# Patient Record
Sex: Female | Born: 1960 | Race: White | Hispanic: No | Marital: Single | Smoking: Former smoker
Health system: Southern US, Community
[De-identification: ages and names within clinical notes are randomized; demographics above are authoritative.]

## PROBLEM LIST (undated history)

## (undated) DIAGNOSIS — M549 Dorsalgia, unspecified: Secondary | ICD-10-CM

## (undated) DIAGNOSIS — K219 Gastro-esophageal reflux disease without esophagitis: Secondary | ICD-10-CM

## (undated) DIAGNOSIS — E785 Hyperlipidemia, unspecified: Secondary | ICD-10-CM

## (undated) DIAGNOSIS — M1990 Unspecified osteoarthritis, unspecified site: Secondary | ICD-10-CM

## (undated) DIAGNOSIS — F32A Depression, unspecified: Secondary | ICD-10-CM

## (undated) DIAGNOSIS — G8929 Other chronic pain: Secondary | ICD-10-CM

## (undated) DIAGNOSIS — K259 Gastric ulcer, unspecified as acute or chronic, without hemorrhage or perforation: Secondary | ICD-10-CM

## (undated) DIAGNOSIS — F419 Anxiety disorder, unspecified: Secondary | ICD-10-CM

## (undated) DIAGNOSIS — I10 Essential (primary) hypertension: Secondary | ICD-10-CM

## (undated) HISTORY — PX: CHOLECYSTECTOMY: SHX55

## (undated) HISTORY — PX: COLONOSCOPY: SHX174

---

## 2019-05-26 ENCOUNTER — Ambulatory Visit: Payer: Managed Care, Other (non HMO) | Attending: Internal Medicine

## 2019-05-26 DIAGNOSIS — Z20822 Contact with and (suspected) exposure to covid-19: Secondary | ICD-10-CM | POA: Insufficient documentation

## 2019-05-27 LAB — NOVEL CORONAVIRUS, NAA: SARS-CoV-2, NAA: NOT DETECTED

## 2019-05-30 ENCOUNTER — Telehealth: Payer: Self-pay | Admitting: General Practice

## 2019-05-30 NOTE — Telephone Encounter (Signed)
Pt is aware covid 19 test is neg on 05-30-2019

## 2019-06-10 ENCOUNTER — Ambulatory Visit: Payer: Managed Care, Other (non HMO) | Attending: Internal Medicine

## 2019-06-10 DIAGNOSIS — Z20822 Contact with and (suspected) exposure to covid-19: Secondary | ICD-10-CM | POA: Insufficient documentation

## 2019-06-11 LAB — NOVEL CORONAVIRUS, NAA: SARS-CoV-2, NAA: NOT DETECTED

## 2019-06-13 ENCOUNTER — Telehealth: Payer: Self-pay | Admitting: General Practice

## 2019-06-13 NOTE — Telephone Encounter (Signed)
Patient informed of negative covid result. Patient verbalized understanding.   

## 2019-06-24 ENCOUNTER — Ambulatory Visit: Payer: Managed Care, Other (non HMO) | Attending: Internal Medicine

## 2019-06-24 DIAGNOSIS — Z20822 Contact with and (suspected) exposure to covid-19: Secondary | ICD-10-CM | POA: Insufficient documentation

## 2019-06-25 LAB — NOVEL CORONAVIRUS, NAA: SARS-CoV-2, NAA: NOT DETECTED

## 2019-08-19 ENCOUNTER — Other Ambulatory Visit: Payer: Self-pay

## 2019-08-19 ENCOUNTER — Ambulatory Visit
Admission: EM | Admit: 2019-08-19 | Discharge: 2019-08-19 | Disposition: A | Discharge status: Home / Self Care | Payer: Managed Care, Other (non HMO) | Attending: Emergency Medicine | Admitting: Emergency Medicine

## 2019-08-19 DIAGNOSIS — N3001 Acute cystitis with hematuria: Secondary | ICD-10-CM

## 2019-08-19 DIAGNOSIS — Z20822 Contact with and (suspected) exposure to covid-19: Secondary | ICD-10-CM | POA: Diagnosis not present

## 2019-08-19 DIAGNOSIS — M791 Myalgia, unspecified site: Secondary | ICD-10-CM

## 2019-08-19 HISTORY — DX: Essential (primary) hypertension: I10

## 2019-08-19 HISTORY — DX: Dorsalgia, unspecified: M54.9

## 2019-08-19 HISTORY — DX: Anxiety disorder, unspecified: F41.9

## 2019-08-19 HISTORY — DX: Gastric ulcer, unspecified as acute or chronic, without hemorrhage or perforation: K25.9

## 2019-08-19 HISTORY — DX: Gastro-esophageal reflux disease without esophagitis: K21.9

## 2019-08-19 LAB — POCT URINALYSIS DIP (MANUAL ENTRY)
Bilirubin, UA: NEGATIVE
Glucose, UA: NEGATIVE mg/dL
Ketones, POC UA: NEGATIVE mg/dL
Nitrite, UA: NEGATIVE
Protein Ur, POC: NEGATIVE mg/dL
Spec Grav, UA: >=1.03 — AB (ref 1.010–1.025)
Urobilinogen, UA: 0.2 E.U./dL
pH, UA: 6 (ref 5.0–8.0)

## 2019-08-19 NOTE — Discharge Instructions (Signed)
Your COVID test is pending - it is important to quarantine / isolate at home until your results are back. °If you test positive and would like further evaluation for persistent or worsening symptoms, you may schedule an E-visit or virtual (video) visit throughout the [facility] My[facility] app or website. ° °PLEASE NOTE: If you develop severe chest pain or shortness of breath please go to the ER or call 9-1-1 for further evaluation --> DO NOT schedule electronic or virtual visits for this. °Please call our office for further guidance / recommendations as needed. ° °For information about the Covid vaccine, please visit [email or web address] °

## 2019-08-19 NOTE — ED Triage Notes (Addendum)
Pt c/o body aches, chills and headache since yesterday. States last time this happened she had a UTI. Pt sent by employer for COVID testing. Denies any known exposure, has not been vaccinated. Denies urinary symptoms but states last time she had a UTI she did not have symptoms.

## 2019-08-19 NOTE — ED Provider Notes (Signed)
[facility]    CSN: [id number] Arrival date & time: 08/19/19  1250      History   Chief Complaint Chief Complaint  Patient presents with  . Generalized Body Aches    HPI Ariana Jarvis is a 59 y.o. female presenting for Covid testing.  States that she has had body aches, chills, headache since yesterday.  No fever, cough, difficulty breathing or chest pain.  States her employer is requesting Covid testing prior to returning to work.  Denies known exposure; has not been vaccinated.  Patient concerned about UTI despite no urinary symptoms.   Past Medical History:  Diagnosis Date  . Anxiety   . Back pain   . Gastric ulcer   . GERD (gastroesophageal reflux disease)   . Hypertension     There are no problems to display for this patient.   Past Surgical History:  Procedure Laterality Date  . CESAREAN SECTION     x2    OB History   No obstetric history on file.      Home Medications    Prior to Admission medications   Medication Sig Start Date End Date Taking? Authorizing Provider  citalopram (CELEXA) 40 MG tablet Take 40 mg by mouth daily.   Yes [provider]  COENZYME Q10 PO Take by mouth.   Yes [provider]  HYDROcodone-acetaminophen (NORCO) 10-325 MG tablet Take 1 tablet by mouth every 6 (six) hours as needed.   Yes [provider]  lisinopril-hydrochlorothiazide (ZESTORETIC) 20-12.5 MG tablet Take 1 tablet by mouth daily.   Yes [provider]  Omega-3 Fatty Acids (FISH OIL PO) Take by mouth.   Yes [provider]  omeprazole (PRILOSEC) 40 MG capsule Take 40 mg by mouth daily.   Yes [provider]  solifenacin (VESICARE) 10 MG tablet Take 10 mg by mouth daily. 08/08/19  Yes [provider]    Family History History reviewed. No pertinent family history.  Social History Social History   Tobacco Use  . Smoking status: Never Smoker  Substance Use Topics  . Alcohol use: Yes    . Drug use: Never     Allergies   Tramadol   Review of Systems As per HPI   Physical Exam Triage Vital Signs ED Triage Vitals  Enc Vitals Group     BP      Pulse      Resp      Temp      Temp src      SpO2      Weight      Height      Head Circumference      Peak Flow      Pain Score      Pain Loc      Pain Edu?      Excl. in GC?    No data found.  Updated Vital Signs BP 139/84   Pulse 82   Temp 97.8 F (36.6 C)   Resp 16   SpO2 99%   Visual Acuity Right Eye Distance:   Left Eye Distance:   Bilateral Distance:    Right Eye Near:   Left Eye Near:    Bilateral Near:     Physical Exam Constitutional:      General: She is not in acute distress. HENT:     Head: Normocephalic and atraumatic.  Eyes:     General: No scleral icterus.    Pupils: Pupils are equal, round, and reactive  to light.  Cardiovascular:     Rate and Rhythm: Normal rate.  Pulmonary:     Effort: Pulmonary effort is normal.  Skin:    Coloration: Skin is not jaundiced or pale.  Neurological:     Mental Status: She is alert and oriented to person, place, and time.      UC Treatments / Results  Labs (all labs ordered are listed, but only abnormal results are displayed) Labs Reviewed  POCT URINALYSIS DIP (MANUAL ENTRY) - Abnormal; Notable for the following components:      Result Value   Spec Grav, UA >=1.030 (*)    Blood, UA trace-intact (*)    Leukocytes, UA Small (1+) (*)    All other components within normal limits  NOVEL CORONAVIRUS, NAA  URINE CULTURE    EKG   Radiology No results found.  Procedures Procedures (including critical care time)  Medications Ordered in UC Medications - No data to display  Initial Impression / Assessment and Plan / UC Course  I have reviewed the triage vital signs and the nursing notes.  Pertinent labs & imaging results that were available during my care of the patient were reviewed by me and considered in my medical decision  making (see chart for details).     Patient afebrile, nontoxic, with SpO2 99%.  Covid PCR pending.  Patient to quarantine until results are back.  We will treat supportively as outlined below.  Urine dipstick significant for leukocytes, blood, elevated specific gravity: Culture pending.  Will defer antibiotics due to lack of symptoms. Return precautions discussed, patient verbalized understanding and is agreeable to plan. Final Clinical Impressions(s) / UC Diagnoses   Final diagnoses:  Encounter for laboratory testing for COVID-19 virus  Myalgia  Acute cystitis with hematuria     Discharge Instructions     Your COVID test is pending - it is important to quarantine / isolate at home until your results are back. If you test positive and would like further evaluation for persistent or worsening symptoms, you may schedule an E-visit or virtual (video) visit throughout the [facility] app or website.  PLEASE NOTE: If you develop severe chest pain or shortness of breath please go to the ER or call 9-1-1 for further evaluation --> DO NOT schedule electronic or virtual visits for this. Please call our office for further guidance / recommendations as needed.  For information about the Covid vaccine, please visit [email or web address]    ED Prescriptions    None     PDMP not reviewed this encounter.   Hall-Potvin, [place], [place] 08/19/19 1335

## 2019-08-20 LAB — URINE CULTURE: Culture: NO GROWTH

## 2019-08-20 LAB — NOVEL CORONAVIRUS, NAA: SARS-CoV-2, NAA: NOT DETECTED

## 2019-08-20 LAB — SARS-COV-2, NAA 2 DAY TAT

## 2019-10-09 ENCOUNTER — Other Ambulatory Visit: Payer: Self-pay

## 2019-10-09 ENCOUNTER — Ambulatory Visit
Admission: EM | Admit: 2019-10-09 | Discharge: 2019-10-09 | Disposition: A | Discharge status: Home / Self Care | Payer: Managed Care, Other (non HMO) | Attending: Physician Assistant | Admitting: Physician Assistant

## 2019-10-09 DIAGNOSIS — J3489 Other specified disorders of nose and nasal sinuses: Secondary | ICD-10-CM

## 2019-10-09 DIAGNOSIS — Z20828 Contact with and (suspected) exposure to other viral communicable diseases: Secondary | ICD-10-CM

## 2019-10-09 DIAGNOSIS — R05 Cough: Secondary | ICD-10-CM

## 2019-10-09 DIAGNOSIS — R059 Cough, unspecified: Secondary | ICD-10-CM

## 2019-10-09 DIAGNOSIS — R0981 Nasal congestion: Secondary | ICD-10-CM

## 2019-10-09 MED ORDER — PROMETHAZINE-DM 6.25-15 MG/5ML PO SYRP
5.0000 mL | ORAL_SOLUTION | Freq: Four times a day (QID) | ORAL | 0 refills | Status: DC | PRN
Start: 2019-10-09 — End: 2020-02-14

## 2019-10-09 MED ORDER — FLUTICASONE PROPIONATE 50 MCG/ACT NA SUSP
2.0000 | Freq: Every day | NASAL | 0 refills | Status: DC
Start: 2019-10-09 — End: 2020-02-14

## 2019-10-09 MED ORDER — BENZONATATE 200 MG PO CAPS
200.0000 mg | ORAL_CAPSULE | Freq: Three times a day (TID) | ORAL | 0 refills | Status: DC
Start: 2019-10-09 — End: 2020-02-14

## 2019-10-09 NOTE — ED Triage Notes (Signed)
Patient presents with c/o dry cough, chest congestion and headache over the past 5 days since receiving her 1st COVID vaccine shot.

## 2019-10-09 NOTE — Discharge Instructions (Signed)
COVID PCR testing ordered. I would like you to quarantine until testing results. Tessalon for cough. Cough syrup as needed for cough. Start flonase nasal spray for nasal drainage/sinus pressure. You can use over the counter nasal saline rinse such as neti pot for nasal congestion. Keep hydrated, your urine should be clear to pale yellow in color. Tylenol/motrin for fever and pain. Monitor for any worsening of symptoms, chest pain, shortness of breath, wheezing, swelling of the throat, go to the emergency department for further evaluation needed.

## 2019-10-09 NOTE — ED Provider Notes (Signed)
[facility]    CSN: [id number] Arrival date & time: 10/09/19  1429      History   Chief Complaint Chief Complaint  Patient presents with  . Cough  . Headache  . chest congestion    HPI Ariana Jarvis is a 59 y.o. female.   59 year old female comes in for 2 day of URI symptom after getting first COVID vaccine 3 days ago. Has had cough, chest congestion, headache, post nasal drainage, body aches. Denies fever, chills. Denies abdominal pain, nausea, vomiting, diarrhea. Denies shortness of breath, loss of taste/smell. Never smoker  [employer], therapeutic vaccine 10/06/2019     Past Medical History:  Diagnosis Date  . Anxiety   . Back pain   . Gastric ulcer   . GERD (gastroesophageal reflux disease)   . Hypertension     There are no problems to display for this patient.   Past Surgical History:  Procedure Laterality Date  . CESAREAN SECTION     x2    OB History   No obstetric history on file.      Home Medications    Prior to Admission medications   Medication Sig Start Date End Date Taking? Authorizing Provider  benzonatate (TESSALON) 200 MG capsule Take 1 capsule (200 mg total) by mouth every 8 (eight) hours. 10/09/19   Cathie Hoops, Terrance Usery V, PA-C  citalopram (CELEXA) 40 MG tablet Take 40 mg by mouth daily.    [provider]  COENZYME Q10 PO Take by mouth.    [provider]  fluticasone (FLONASE) 50 MCG/ACT nasal spray Place 2 sprays into both nostrils daily. 10/09/19   Belinda Fisher, PA-C  HYDROcodone-acetaminophen (NORCO) 10-325 MG tablet Take 1 tablet by mouth every 6 (six) hours as needed.    [provider]  lisinopril-hydrochlorothiazide (ZESTORETIC) 20-12.5 MG tablet Take 1 tablet by mouth daily.    [provider]  Omega-3 Fatty Acids (FISH OIL PO) Take by mouth.    [provider]  omeprazole (PRILOSEC) 40 MG capsule Take 40 mg by mouth daily.    [provider]  promethazine-dextromethorphan  (PROMETHAZINE-DM) 6.25-15 MG/5ML syrup Take 5 mLs by mouth 4 (four) times daily as needed for cough. 10/09/19   Cathie Hoops, Eilan Mcinerny V, PA-C  solifenacin (VESICARE) 10 MG tablet Take 10 mg by mouth daily. 08/08/19   [provider]    Family History Family History  Problem Relation Age of Onset  . Hypertension Mother   . Hypertension Father   . Cancer Father        lung     Social History Social History   Tobacco Use  . Smoking status: Never Smoker  . Smokeless tobacco: Never Used  Substance Use Topics  . Alcohol use: Yes  . Drug use: Never     Allergies   Tramadol   Review of Systems Review of Systems  Reason unable to perform ROS: See HPI as above.     Physical Exam Triage Vital Signs ED Triage Vitals  Enc Vitals Group     BP 10/09/19 1441 (!) 183/86     Pulse Rate 10/09/19 1441 87     Resp 10/09/19 1441 16     Temp 10/09/19 1441 99.1 F (37.3 C)     Temp Source 10/09/19 1441 Oral     SpO2 10/09/19 1441 95 %     Weight --      Height --      Head Circumference --  Peak Flow --      Pain Score 10/09/19 1443 4     Pain Loc --      Pain Edu? --      Excl. in GC? --    No data found.  Updated Vital Signs BP (!) 183/86 (BP Location: Left Arm)   Pulse 87   Temp 99.1 F (37.3 C) (Oral)   Resp 16   SpO2 95%   Physical Exam Constitutional:      General: She is not in acute distress.    Appearance: Normal appearance. She is well-developed. She is not ill-appearing, toxic-appearing or diaphoretic.  HENT:     Head: Normocephalic and atraumatic.     Right Ear: Tympanic membrane, ear canal and external ear normal. Tympanic membrane is not erythematous or bulging.     Left Ear: Tympanic membrane, ear canal and external ear normal. Tympanic membrane is not erythematous or bulging.     Nose: Congestion present.     Right Sinus: No maxillary sinus tenderness or frontal sinus tenderness.     Left Sinus: No maxillary sinus tenderness or frontal sinus tenderness.      Mouth/Throat:     Mouth: Mucous membranes are moist.     Pharynx: Oropharynx is clear. Uvula midline.  Eyes:     Conjunctiva/sclera: Conjunctivae normal.     Pupils: Pupils are equal, round, and reactive to light.  Cardiovascular:     Rate and Rhythm: Normal rate and regular rhythm.  Pulmonary:     Effort: Pulmonary effort is normal. No accessory muscle usage, prolonged expiration, respiratory distress or retractions.     Breath sounds: No decreased air movement or transmitted upper airway sounds. No decreased breath sounds.     Comments: LCTAB. Coughing throughout exam. Musculoskeletal:     Cervical back: Normal range of motion and neck supple.  Skin:    General: Skin is warm and dry.  Neurological:     Mental Status: She is alert and oriented to person, place, and time.      UC Treatments / Results  Labs (all labs ordered are listed, but only abnormal results are displayed) Labs Reviewed  NOVEL CORONAVIRUS, NAA    EKG   Radiology No results found.  Procedures Procedures (including critical care time)  Medications Ordered in UC Medications - No data to display  Initial Impression / Assessment and Plan / UC Course  I have reviewed the triage vital signs and the nursing notes.  Pertinent labs & imaging results that were available during my care of the patient were reviewed by me and considered in my medical decision making (see chart for details).    Patient afebrile, nontoxic in appearance.  Lungs clear to auscultation bilaterally without adventitious lung sounds.  However, coughing throughout exam. ? Post vaccine ADR vs viral illness vs seasonal allergies. Will swab for COVID. Symptomatic management discussed. Push fluids. Return precautions given.  Final Clinical Impressions(s) / UC Diagnoses   Final diagnoses:  Cough  Nasal congestion  Sinus pressure   ED Prescriptions    Medication Sig Dispense Auth. Provider   fluticasone (FLONASE) 50 MCG/ACT nasal  spray Place 2 sprays into both nostrils daily. 1 g Jewelz Kobus V, PA-C   benzonatate (TESSALON) 200 MG capsule Take 1 capsule (200 mg total) by mouth every 8 (eight) hours. 21 capsule [name] V, PA-C   promethazine-dextromethorphan (PROMETHAZINE-DM) 6.25-15 MG/5ML syrup Take 5 mLs by mouth 4 (four) times daily as needed for cough. 118 mL  Ok Edwards, PA-C     I have reviewed the PDMP during this encounter.   Ok Edwards, PA-C 10/09/19 1513

## 2019-10-10 LAB — NOVEL CORONAVIRUS, NAA: SARS-CoV-2, NAA: NOT DETECTED

## 2019-10-10 LAB — SARS-COV-2, NAA 2 DAY TAT

## 2020-02-14 ENCOUNTER — Ambulatory Visit
Admission: EM | Admit: 2020-02-14 | Discharge: 2020-02-14 | Disposition: A | Discharge status: Home / Self Care | Payer: Managed Care, Other (non HMO) | Attending: Emergency Medicine | Admitting: Emergency Medicine

## 2020-02-14 ENCOUNTER — Other Ambulatory Visit: Payer: Self-pay

## 2020-02-14 DIAGNOSIS — N3000 Acute cystitis without hematuria: Secondary | ICD-10-CM | POA: Insufficient documentation

## 2020-02-14 LAB — POCT URINALYSIS DIP (MANUAL ENTRY)
Bilirubin, UA: NEGATIVE
Blood, UA: NEGATIVE
Glucose, UA: NEGATIVE mg/dL
Ketones, POC UA: NEGATIVE mg/dL
Nitrite, UA: NEGATIVE
Protein Ur, POC: NEGATIVE mg/dL
Spec Grav, UA: 1.02 (ref 1.010–1.025)
Urobilinogen, UA: 1 E.U./dL
pH, UA: 7.5 (ref 5.0–8.0)

## 2020-02-14 MED ORDER — CEPHALEXIN 500 MG PO CAPS: 500.0000 mg | ORAL_CAPSULE | Freq: Two times a day (BID) | ORAL | 0 refills | Status: AC

## 2020-02-14 NOTE — ED Triage Notes (Signed)
Pt c/o lower back pain for the past 2wks. States feels like when she gets an UTI. Denies urinary sx's except color of urine and strong odor. States took some antibotics that she had from last time and it started to help some.

## 2020-02-14 NOTE — ED Provider Notes (Signed)
[facility]    CSN: [id number] Arrival date & time: 02/14/20  1047      History   Chief Complaint Chief Complaint  Patient presents with  . Urinary Tract Infection    HPI Ariana Jarvis is a 59 y.o. female  With history as below presenting for possible UTI.  Patient nursing history thereof: States it feels similar.  Presents with bilateral low back pain, bladder distention that is alleviated with urinating.  Has had some odor and discoloration.  States that she took unknown antibiotics at home from previous infection and helped a little bit.  Denying fever, hematuria, nausea or vomiting.  Past Medical History:  Diagnosis Date  . Anxiety   . Back pain   . Gastric ulcer   . GERD (gastroesophageal reflux disease)   . Hypertension     There are no problems to display for this patient.   Past Surgical History:  Procedure Laterality Date  . CESAREAN SECTION     x2    OB History   No obstetric history on file.      Home Medications    Prior to Admission medications   Medication Sig Start Date End Date Taking? Authorizing Provider  cephALEXin (KEFLEX) 500 MG capsule Take 1 capsule (500 mg total) by mouth 2 (two) times daily for 3 days. 02/14/20 02/17/20  Hall-Potvin, [place], PA-C  citalopram (CELEXA) 40 MG tablet Take 40 mg by mouth daily.    [provider]  COENZYME Q10 PO Take by mouth.    [provider]  HYDROcodone-acetaminophen (NORCO) 10-325 MG tablet Take 1 tablet by mouth every 6 (six) hours as needed.    [provider]  lisinopril-hydrochlorothiazide (ZESTORETIC) 20-12.5 MG tablet Take 1 tablet by mouth daily.    [provider]  Omega-3 Fatty Acids (FISH OIL PO) Take by mouth.    [provider]  omeprazole (PRILOSEC) 40 MG capsule Take 40 mg by mouth daily.    [provider]  solifenacin (VESICARE) 10 MG tablet Take 10 mg by mouth daily. 08/08/19   [provider]    Family  History Family History  Problem Relation Age of Onset  . Hypertension Mother   . Hypertension Father   . Cancer Father        lung     Social History Social History   Tobacco Use  . Smoking status: Never Smoker  . Smokeless tobacco: Never Used  Substance Use Topics  . Alcohol use: Yes  . Drug use: Never     Allergies   Tramadol   Review of Systems Review of Systems  Constitutional: Negative for fatigue and fever.  Respiratory: Negative for cough and shortness of breath.   Cardiovascular: Negative for chest pain and palpitations.  Gastrointestinal: Negative for constipation and diarrhea.  Genitourinary: Positive for dysuria and urgency. Negative for flank pain, frequency, hematuria, pelvic pain, vaginal bleeding, vaginal discharge and vaginal pain.     Physical Exam Triage Vital Signs ED Triage Vitals  Enc Vitals Group     BP      Pulse      Resp      Temp      Temp src      SpO2      Weight      Height      Head Circumference      Peak Flow      Pain Score      Pain Loc  Pain Edu?      Excl. in GC?    No data found.  Updated Vital Signs BP (!) 153/73 (BP Location: Left Arm)   Pulse (!) 57   Temp 98.1 F (36.7 C) (Oral)   Resp 18   SpO2 95%   Visual Acuity Right Eye Distance:   Left Eye Distance:   Bilateral Distance:    Right Eye Near:   Left Eye Near:    Bilateral Near:     Physical Exam Constitutional:      General: She is not in acute distress. HENT:     Head: Normocephalic and atraumatic.  Eyes:     General: No scleral icterus.    Pupils: Pupils are equal, round, and reactive to light.  Cardiovascular:     Rate and Rhythm: Normal rate.  Pulmonary:     Effort: Pulmonary effort is normal.  Abdominal:     General: Bowel sounds are normal.     Palpations: Abdomen is soft.     Tenderness: There is no abdominal tenderness. There is no right CVA tenderness, left CVA tenderness or guarding.  Skin:    Coloration: Skin is not  jaundiced or pale.  Neurological:     Mental Status: She is alert and oriented to person, place, and time.      UC Treatments / Results  Labs (all labs ordered are listed, but only abnormal results are displayed) Labs Reviewed  POCT URINALYSIS DIP (MANUAL ENTRY) - Abnormal; Notable for the following components:      Result Value   Leukocytes, UA Trace (*)    All other components within normal limits  URINE CULTURE    EKG   Radiology No results found.  Procedures Procedures (including critical care time)  Medications Ordered in UC Medications - No data to display  Initial Impression / Assessment and Plan / UC Course  I have reviewed the triage vital signs and the nursing notes.  Pertinent labs & imaging results that were available during my care of the patient were reviewed by me and considered in my medical decision making (see chart for details).     Urine with trace leukocytes, the patient did take unknown antibiotic.  Will send for culture, treat empirically in the interim given history.  Discussed at length taking prescription medications as prescribed.  Return precautions discussed, pt verbalized understanding and is agreeable to plan. Final Clinical Impressions(s) / UC Diagnoses   Final diagnoses:  Acute cystitis without hematuria     Discharge Instructions     Take antibiotic twice daily with food. Important to drink plenty of water throughout the day. Return for worsening urinary symptoms, blood in urine, abdominal or back pain, fever.    ED Prescriptions    Medication Sig Dispense Auth. Provider   cephALEXin (KEFLEX) 500 MG capsule Take 1 capsule (500 mg total) by mouth 2 (two) times daily for 3 days. 6 capsule Hall-Potvin, [place], PA-C     PDMP not reviewed this encounter.   Hall-Potvin, [place], [place] 02/14/20 1138

## 2020-02-14 NOTE — Discharge Instructions (Addendum)
Take antibiotic twice daily with food. Important to drink plenty of water throughout the day. Return for worsening urinary symptoms, blood in urine, abdominal or back pain, fever. 

## 2020-02-15 LAB — URINE CULTURE

## 2020-02-20 ENCOUNTER — Ambulatory Visit
Admission: EM | Admit: 2020-02-20 | Discharge: 2020-02-20 | Disposition: A | Discharge status: Home / Self Care | Payer: Managed Care, Other (non HMO) | Attending: Emergency Medicine | Admitting: Emergency Medicine

## 2020-02-20 DIAGNOSIS — Z20822 Contact with and (suspected) exposure to covid-19: Secondary | ICD-10-CM

## 2020-02-20 NOTE — Discharge Instructions (Signed)

## 2020-02-20 NOTE — ED Triage Notes (Signed)
Pt states had a covid exposure at work on Friday. States needs testing for work. Pt denies sx's.

## 2020-02-21 LAB — NOVEL CORONAVIRUS, NAA: SARS-CoV-2, NAA: NOT DETECTED

## 2020-02-21 LAB — SARS-COV-2, NAA 2 DAY TAT

## 2020-04-09 ENCOUNTER — Ambulatory Visit
Admission: EM | Admit: 2020-04-09 | Discharge: 2020-04-09 | Disposition: A | Discharge status: Home / Self Care | Payer: Managed Care, Other (non HMO)

## 2020-04-09 ENCOUNTER — Other Ambulatory Visit: Payer: Self-pay

## 2020-04-09 DIAGNOSIS — K1121 Acute sialoadenitis: Secondary | ICD-10-CM | POA: Diagnosis not present

## 2020-04-09 NOTE — ED Provider Notes (Signed)
[facility]    CSN: [id number] Arrival date & time: 04/09/20  1622      History   Chief Complaint Chief Complaint  Patient presents with  . Wound Check    Since last night    HPI Ariana Jarvis is a 59 y.o. female  Presenting for right cheek pain, swelling since last night.  Denies injury or inciting event.  Does not currently have teeth, nor does she wear dentures.  No ear pain, discharge, fever, tinnitus or dizziness.  Has not tried anything for this.  Nursing history of chronic dry mouth.  Past Medical History:  Diagnosis Date  . Anxiety   . Back pain   . Gastric ulcer   . GERD (gastroesophageal reflux disease)   . Hypertension     There are no problems to display for this patient.   Past Surgical History:  Procedure Laterality Date  . CESAREAN SECTION     x2    OB History   No obstetric history on file.      Home Medications    Prior to Admission medications   Medication Sig Start Date End Date Taking? Authorizing Provider  citalopram (CELEXA) 40 MG tablet Take 40 mg by mouth daily.   Yes [provider]  COENZYME Q10 PO Take by mouth.   Yes [provider]  gabapentin (NEURONTIN) 300 MG capsule Take 300 mg by mouth 3 (three) times daily.   Yes [provider]  HYDROcodone-acetaminophen (NORCO) 10-325 MG tablet Take 1 tablet by mouth every 6 (six) hours as needed.   Yes [provider]  lisinopril-hydrochlorothiazide (ZESTORETIC) 20-12.5 MG tablet Take 1 tablet by mouth daily.   Yes [provider]  Omega-3 Fatty Acids (FISH OIL PO) Take by mouth.   Yes [provider]  omeprazole (PRILOSEC) 40 MG capsule Take 40 mg by mouth daily.   Yes [provider]  solifenacin (VESICARE) 10 MG tablet Take 10 mg by mouth daily. 08/08/19  Yes [provider]    Family History Family History  Problem Relation Age of Onset  . Hypertension Mother   . Hypertension Father   .  Cancer Father        lung     Social History Social History   Tobacco Use  . Smoking status: Never Smoker  . Smokeless tobacco: Never Used  Vaping Use  . Vaping Use: Never used  Substance Use Topics  . Alcohol use: Yes  . Drug use: Never     Allergies   Tramadol   Review of Systems Review of Systems  Constitutional: Negative for fatigue and fever.  HENT: Negative for congestion, dental problem, ear pain, facial swelling, hearing loss, sinus pain, sore throat, trouble swallowing and voice change.   Eyes: Negative for photophobia, pain and visual disturbance.  Respiratory: Negative for cough and shortness of breath.   Cardiovascular: Negative for chest pain and palpitations.  Gastrointestinal: Negative for diarrhea and vomiting.  Musculoskeletal: Negative for arthralgias and myalgias.  Neurological: Negative for dizziness and headaches.     Physical Exam Triage Vital Signs ED Triage Vitals  Enc Vitals Group     BP      Pulse      Resp      Temp      Temp src      SpO2      Weight      Height      Head Circumference      Peak  Flow      Pain Score      Pain Loc      Pain Edu?      Excl. in GC?    No data found.  Updated Vital Signs BP (!) 144/81 (BP Location: Left Arm)   Pulse 63   Temp 98.2 F (36.8 C) (Oral)   Resp 18   SpO2 95%   Visual Acuity Right Eye Distance:   Left Eye Distance:   Bilateral Distance:    Right Eye Near:   Left Eye Near:    Bilateral Near:     Physical Exam Constitutional:      General: She is not in acute distress. HENT:     Head: Normocephalic and atraumatic.     Jaw: There is normal jaw occlusion. No tenderness or pain on movement.     Right Ear: Hearing, tympanic membrane, ear canal and external ear normal. No tenderness. No mastoid tenderness.     Left Ear: Hearing, tympanic membrane, ear canal and external ear normal. No tenderness. No mastoid tenderness.     Nose: No nasal deformity, septal deviation or nasal  tenderness.     Right Turbinates: Not swollen or pale.     Left Turbinates: Not swollen or pale.     Right Sinus: No maxillary sinus tenderness or frontal sinus tenderness.     Left Sinus: No maxillary sinus tenderness or frontal sinus tenderness.     Mouth/Throat:     Lips: Pink. No lesions.     Mouth: Mucous membranes are moist. No injury.     Pharynx: Oropharynx is clear. Uvula midline. No posterior oropharyngeal erythema or uvula swelling.     Comments: no tonsillar exudate or hypertrophy Cardiovascular:     Rate and Rhythm: Normal rate.  Pulmonary:     Effort: Pulmonary effort is normal.  Musculoskeletal:     Cervical back: Normal range of motion and neck supple. No muscular tenderness.  Lymphadenopathy:     Head:     Right side of head: Submandibular adenopathy present.     Left side of head: No submandibular adenopathy.     Cervical: No cervical adenopathy.     Right cervical: No superficial cervical adenopathy.    Left cervical: No superficial cervical adenopathy.  Neurological:     Mental Status: She is alert and oriented to person, place, and time.      UC Treatments / Results  Labs (all labs ordered are listed, but only abnormal results are displayed) Labs Reviewed  NOVEL CORONAVIRUS, NAA    EKG   Radiology No results found.  Procedures Procedures (including critical care time)  Medications Ordered in UC Medications - No data to display  Initial Impression / Assessment and Plan / UC Course  I have reviewed the triage vital signs and the nursing notes.  Pertinent labs & imaging results that were available during my care of the patient were reviewed by me and considered in my medical decision making (see chart for details).     Patient afebrile, nontoxic, with SpO2 95%.  Discussed lymphadenopathy VS acute parotitis.  Patient is fully vaccinated, swelling unilateral.  Requesting Covid testing-Covid PCR pending.  Patient to quarantine until results are  back.  We will treat supportively as outlined below.  Return precautions discussed, patient verbalized understanding and is agreeable to plan. Final Clinical Impressions(s) / UC Diagnoses   Final diagnoses:  Acute parotitis   Discharge Instructions   None    ED Prescriptions  None     PDMP not reviewed this encounter.   Hall-Potvin, [place], [place] 04/09/20 1715

## 2020-04-09 NOTE — ED Triage Notes (Signed)
Patient states that she woke from pain in her right cheek and noticed swelling and redness that has worsened throughout the day. Pt is aox4 and ambulatory.

## 2020-04-11 LAB — SARS-COV-2, NAA 2 DAY TAT

## 2020-04-11 LAB — NOVEL CORONAVIRUS, NAA: SARS-CoV-2, NAA: NOT DETECTED

## 2020-04-22 ENCOUNTER — Other Ambulatory Visit: Payer: Self-pay

## 2020-04-22 ENCOUNTER — Emergency Department (HOSPITAL_COMMUNITY)
Admission: EM | Admit: 2020-04-22 | Discharge: 2020-04-23 | Disposition: A | Discharge status: Home / Self Care | Payer: BC Managed Care – PPO | Attending: Emergency Medicine | Admitting: Emergency Medicine

## 2020-04-22 ENCOUNTER — Emergency Department (HOSPITAL_COMMUNITY): Payer: BC Managed Care – PPO

## 2020-04-22 DIAGNOSIS — U071 COVID-19: Secondary | ICD-10-CM | POA: Insufficient documentation

## 2020-04-22 DIAGNOSIS — Z5321 Procedure and treatment not carried out due to patient leaving prior to being seen by health care provider: Secondary | ICD-10-CM | POA: Diagnosis not present

## 2020-04-22 DIAGNOSIS — R0789 Other chest pain: Secondary | ICD-10-CM | POA: Diagnosis present

## 2020-04-22 LAB — CBC
HCT: 43.3 % (ref 36.0–46.0)
Hemoglobin: 14.8 g/dL (ref 12.0–15.0)
MCH: 28.6 pg (ref 26.0–34.0)
MCHC: 34.2 g/dL (ref 30.0–36.0)
MCV: 83.6 fL (ref 80.0–100.0)
Platelets: 191 10*3/uL (ref 150–400)
RBC: 5.18 MIL/uL — ABNORMAL HIGH (ref 3.87–5.11)
RDW: 13 % (ref 11.5–15.5)
WBC: 7.2 10*3/uL (ref 4.0–10.5)
nRBC: 0 % (ref 0.0–0.2)

## 2020-04-22 LAB — BASIC METABOLIC PANEL
Anion gap: 7 (ref 5–15)
BUN: 10 mg/dL (ref 6–20)
CO2: 23 mmol/L (ref 22–32)
Calcium: 10.4 mg/dL — ABNORMAL HIGH (ref 8.9–10.3)
Chloride: 106 mmol/L (ref 98–111)
Creatinine, Ser: 0.82 mg/dL (ref 0.44–1.00)
GFR, Estimated: >60 mL/min (ref >60)
Glucose, Bld: 103 mg/dL — ABNORMAL HIGH (ref 70–99)
Potassium: 4.5 mmol/L (ref 3.5–5.1)
Sodium: 136 mmol/L (ref 135–145)

## 2020-04-22 LAB — TROPONIN I (HIGH SENSITIVITY): Troponin I (High Sensitivity): 2 ng/L (ref <18)

## 2020-04-22 NOTE — ED Triage Notes (Signed)
Reported tested + for covid today, symptoms started yesterday. Patient c/o sharp mid chest pain; endorsed hx of HTN.

## 2020-04-23 NOTE — ED Notes (Signed)
Pt left due to not being seen quick enough 

## 2020-06-12 ENCOUNTER — Other Ambulatory Visit: Payer: Self-pay | Admitting: Orthopaedic Surgery

## 2020-06-12 DIAGNOSIS — M545 Low back pain, unspecified: Secondary | ICD-10-CM

## 2020-06-17 ENCOUNTER — Ambulatory Visit
Admission: RE | Admit: 2020-06-17 | Discharge: 2020-06-17 | Disposition: A | Discharge status: Home / Self Care | Payer: BC Managed Care – PPO | Source: Ambulatory Visit | Attending: Orthopaedic Surgery | Admitting: Orthopaedic Surgery

## 2020-06-17 ENCOUNTER — Other Ambulatory Visit: Payer: Self-pay

## 2020-06-17 DIAGNOSIS — M545 Low back pain, unspecified: Secondary | ICD-10-CM

## 2020-07-25 ENCOUNTER — Other Ambulatory Visit: Payer: Self-pay

## 2020-07-25 ENCOUNTER — Ambulatory Visit
Admission: EM | Admit: 2020-07-25 | Discharge: 2020-07-25 | Disposition: A | Discharge status: Home / Self Care | Payer: BC Managed Care – PPO | Attending: Family Medicine | Admitting: Family Medicine

## 2020-07-25 DIAGNOSIS — I959 Hypotension, unspecified: Secondary | ICD-10-CM

## 2020-07-25 DIAGNOSIS — R220 Localized swelling, mass and lump, head: Secondary | ICD-10-CM

## 2020-07-25 DIAGNOSIS — L0201 Cutaneous abscess of face: Secondary | ICD-10-CM

## 2020-07-25 MED ORDER — PREDNISONE 20 MG PO TABS: 20.0000 mg | ORAL_TABLET | Freq: Every day | ORAL | 0 refills | Status: AC

## 2020-07-25 MED ORDER — DEXAMETHASONE SODIUM PHOSPHATE 10 MG/ML IJ SOLN
10.0000 mg | Freq: Once | INTRAMUSCULAR | Status: AC
  Administered 2020-07-25: 10 mg via INTRAMUSCULAR

## 2020-07-25 MED ORDER — DOXYCYCLINE HYCLATE 100 MG PO CAPS: 100.0000 mg | ORAL_CAPSULE | Freq: Two times a day (BID) | ORAL | 0 refills | Status: DC

## 2020-07-25 MED ORDER — ONDANSETRON 4 MG PO TBDP: 4.0000 mg | ORAL_TABLET | Freq: Three times a day (TID) | ORAL | 0 refills | Status: DC | PRN

## 2020-07-25 MED ORDER — CEFTRIAXONE SODIUM 1 G IJ SOLR
1.0000 g | Freq: Once | INTRAMUSCULAR | Status: AC
  Administered 2020-07-25: 1 g via INTRAMUSCULAR

## 2020-07-25 NOTE — ED Provider Notes (Signed)
[facility]    CSN: [id number] Arrival date & time: 07/25/20  1042      History   Chief Complaint Chief Complaint  Patient presents with  . Abscess  . Facial Swelling    HPI Ariana Jarvis is a 60 y.o. female.   HPI Patient presents today with abscess and left lower facial swelling extending into the left buccal region. Patient is unaware if she had been bitten by insect. The lesion started as a small bump and over the week has increased in diameter. Denies fever. BP soft on initial check on recheck 108/64. She took her BP medication today.  Endorses nausea without vomiting that began last night. She has not take any tylenol or ibuprofen today. Past Medical History:  Diagnosis Date  . Anxiety   . Back pain   . Gastric ulcer   . GERD (gastroesophageal reflux disease)   . Hypertension     There are no problems to display for this patient.   Past Surgical History:  Procedure Laterality Date  . CESAREAN SECTION     x2    OB History   No obstetric history on file.      Home Medications    Prior to Admission medications   Medication Sig Start Date End Date Taking? Authorizing Provider  doxycycline (VIBRAMYCIN) 100 MG capsule Take 1 capsule (100 mg total) by mouth 2 (two) times daily. 07/25/20  Yes Bing Neighbors, FNP  ondansetron (ZOFRAN ODT) 4 MG disintegrating tablet Take 1 tablet (4 mg total) by mouth every 8 (eight) hours as needed for nausea or vomiting. 07/25/20  Yes Bing Neighbors, FNP  predniSONE (DELTASONE) 20 MG tablet Take 1 tablet (20 mg total) by mouth daily with breakfast for 5 days. 07/26/20 07/31/20 Yes Bing Neighbors, FNP  citalopram (CELEXA) 40 MG tablet Take 40 mg by mouth daily.    [provider]  COENZYME Q10 PO Take by mouth.    [provider]  gabapentin (NEURONTIN) 300 MG capsule Take 300 mg by mouth 3 (three) times daily.    [provider]  HYDROcodone-acetaminophen (NORCO) 10-325 MG  tablet Take 1 tablet by mouth every 6 (six) hours as needed.    [provider]  lisinopril-hydrochlorothiazide (ZESTORETIC) 20-12.5 MG tablet Take 1 tablet by mouth daily.    [provider]  Omega-3 Fatty Acids (FISH OIL PO) Take by mouth.    [provider]  omeprazole (PRILOSEC) 40 MG capsule Take 40 mg by mouth daily.    [provider]  solifenacin (VESICARE) 10 MG tablet Take 10 mg by mouth daily. 08/08/19   [provider]    Family History Family History  Problem Relation Age of Onset  . Hypertension Mother   . Hypertension Father   . Cancer Father        lung     Social History Social History   Tobacco Use  . Smoking status: Never Smoker  . Smokeless tobacco: Never Used  Vaping Use  . Vaping Use: Never used  Substance Use Topics  . Alcohol use: Yes  . Drug use: Never     Allergies   Tramadol   Review of Systems Review of Systems Pertinent negatives listed in HPI  Physical Exam Triage Vital Signs ED Triage Vitals  Enc Vitals Group     BP 07/25/20 1244 (!) 94/57     Pulse Rate 07/25/20 1244 75     Resp 07/25/20 1244 18  Temp 07/25/20 1244 98.3 F (36.8 C)     Temp Source 07/25/20 1244 Oral     SpO2 07/25/20 1244 96 %     Weight --      Height --      Head Circumference --      Peak Flow --      Pain Score 07/25/20 1243 5     Pain Loc --      Pain Edu? --      Excl. in GC? --    No data found.  Updated Vital Signs BP (!) 94/57 (BP Location: Left Arm)   Pulse 75   Temp 98.3 F (36.8 C) (Oral)   Resp 18   SpO2 96%   Visual Acuity Right Eye Distance:   Left Eye Distance:   Bilateral Distance:    Right Eye Near:   Left Eye Near:    Bilateral Near:     Physical Exam Constitutional:      Appearance: She is obese.  HENT:     Head:      Right Ear: Hearing normal.     Left Ear: Hearing normal.     Mouth/Throat:     Mouth: Mucous membranes are moist.  Cardiovascular:     Rate and  Rhythm: Normal rate and regular rhythm.  Pulmonary:     Effort: Pulmonary effort is normal.     Breath sounds: Normal breath sounds and air entry. No decreased air movement.  Neurological:     Mental Status: She is alert.  Psychiatric:        Attention and Perception: Attention normal.        Mood and Affect: Mood normal.        Speech: Speech normal.        Cognition and Memory: Cognition and memory normal.      UC Treatments / Results  Labs (all labs ordered are listed, but only abnormal results are displayed) Labs Reviewed  CBC WITH DIFFERENTIAL/PLATELET    EKG   Radiology No results found.  Procedures Procedures (including critical care time)  Medications Ordered in UC Medications  dexamethasone (DECADRON) injection 10 mg (10 mg Intramuscular Given 07/25/20 1330)  cefTRIAXone (ROCEPHIN) injection 1 g (1 g Intramuscular Given 07/25/20 1333)    Initial Impression / Assessment and Plan / UC Course  I have reviewed the triage vital signs and the nursing notes.  Pertinent labs & imaging results that were available during my care of the patient were reviewed by me and considered in my medical decision making (see chart for details).     Facial abscess/facial swelling treating in clinic today with Decadron 10 mg IM along with 1 g of Rocephin.  Patient discharged home with doxycycline and advised to attempt to get 2 doses in today.  Start oral prednisone in the morning. Patient advised to schedule an appointment to come in tomorrow for a recheck of facial swelling and abscess.  If abscess remains well or has increased in diameter will refer patient to the ER, however if improvement is seen we will continue treatment with antibiotics and have patient follow-up outpatient with general surgery.  Red flag precautions discussed that warrant immediate evaluation in the setting of the ER.  Patient verbalized understanding and agreement with plan. Final Clinical Impressions(s) / UC  Diagnoses   Final diagnoses:  Facial abscess  Facial swelling  Hypotension, unspecified hypotension type     Discharge Instructions     Take a dose of  doxycyline as as you pick up the medication and take another dose prior to bedtime.  Start prednisone in the morning.   Take tylenol and apply warm compresses for pain .  Zofran for nausea.  If you develop fever, or worsening facial swelling go immediately to the emergency department     ED Prescriptions    Medication Sig Dispense Auth. Provider   doxycycline (VIBRAMYCIN) 100 MG capsule Take 1 capsule (100 mg total) by mouth 2 (two) times daily. 20 capsule Bing Neighbors, FNP   predniSONE (DELTASONE) 20 MG tablet Take 1 tablet (20 mg total) by mouth daily with breakfast for 5 days. 5 tablet Bing Neighbors, FNP   ondansetron (ZOFRAN ODT) 4 MG disintegrating tablet Take 1 tablet (4 mg total) by mouth every 8 (eight) hours as needed for nausea or vomiting. 20 tablet Bing Neighbors, FNP     PDMP not reviewed this encounter.   Bing Neighbors, FNP 07/25/20 [phone]

## 2020-07-25 NOTE — Discharge Instructions (Signed)
Take a dose of doxycyline as as you pick up the medication and take another dose prior to bedtime.  Start prednisone in the morning.   Take tylenol and apply warm compresses for pain .  Zofran for nausea.  If you develop fever, or worsening facial swelling go immediately to the emergency department

## 2020-07-25 NOTE — ED Notes (Signed)
Pt instructed to make an appt to come back tomorrow to f/u with abscess. Pt understood and appt has been made.

## 2020-07-25 NOTE — ED Triage Notes (Signed)
Pt is present today with facial swelling and an a abscess. Pt states that she noticed the abscess last week. Pt states that along with the abscess she has had nausea and HA. Pt states that she tried to pop it last night and puss and blood drained from the site.

## 2020-07-26 ENCOUNTER — Ambulatory Visit
Admission: EM | Admit: 2020-07-26 | Discharge: 2020-07-26 | Disposition: A | Discharge status: Home / Self Care | Payer: BC Managed Care – PPO | Attending: Internal Medicine | Admitting: Internal Medicine

## 2020-07-26 VITALS — BP 124/79 | HR 58 | Temp 97.9°F | Resp 17

## 2020-07-26 DIAGNOSIS — R220 Localized swelling, mass and lump, head: Secondary | ICD-10-CM

## 2020-07-26 DIAGNOSIS — Z5189 Encounter for other specified aftercare: Secondary | ICD-10-CM

## 2020-07-26 DIAGNOSIS — L0201 Cutaneous abscess of face: Secondary | ICD-10-CM

## 2020-07-26 LAB — CBC WITH DIFFERENTIAL/PLATELET
Basophils Absolute: 0.1 10*3/uL (ref 0.0–0.2)
Basos: 0 %
EOS (ABSOLUTE): 0.5 10*3/uL — ABNORMAL HIGH (ref 0.0–0.4)
Eos: 3 %
Hematocrit: 48.9 % — ABNORMAL HIGH (ref 34.0–46.6)
Hemoglobin: 16.3 g/dL — ABNORMAL HIGH (ref 11.1–15.9)
Immature Grans (Abs): 0 10*3/uL (ref 0.0–0.1)
Immature Granulocytes: 0 %
Lymphocytes Absolute: 2.4 10*3/uL (ref 0.7–3.1)
Lymphs: 13 %
MCH: 29.1 pg (ref 26.6–33.0)
MCHC: 33.3 g/dL (ref 31.5–35.7)
MCV: 87 fL (ref 79–97)
Monocytes Absolute: 1.3 10*3/uL — ABNORMAL HIGH (ref 0.1–0.9)
Monocytes: 7 %
Neutrophils Absolute: 14.7 10*3/uL — ABNORMAL HIGH (ref 1.4–7.0)
Neutrophils: 77 %
Platelets: 303 10*3/uL (ref 150–450)
RBC: 5.61 x10E6/uL — ABNORMAL HIGH (ref 3.77–5.28)
RDW: 13.4 % (ref 11.7–15.4)
WBC: 18.9 10*3/uL — ABNORMAL HIGH (ref 3.4–10.8)

## 2020-07-26 NOTE — ED Triage Notes (Signed)
Pt presents for follow-up after abscess drainage yesterday. States feeling significantly better. States still draining slightly.

## 2021-01-17 ENCOUNTER — Other Ambulatory Visit: Payer: Self-pay | Admitting: Internal Medicine

## 2021-01-17 DIAGNOSIS — Z1231 Encounter for screening mammogram for malignant neoplasm of breast: Secondary | ICD-10-CM

## 2021-02-19 ENCOUNTER — Other Ambulatory Visit: Payer: Self-pay

## 2021-02-19 ENCOUNTER — Ambulatory Visit
Admission: RE | Admit: 2021-02-19 | Discharge: 2021-02-19 | Disposition: A | Discharge status: Home / Self Care | Payer: BC Managed Care – PPO | Source: Ambulatory Visit | Attending: Internal Medicine | Admitting: Internal Medicine

## 2021-02-19 DIAGNOSIS — Z1231 Encounter for screening mammogram for malignant neoplasm of breast: Secondary | ICD-10-CM

## 2021-03-18 ENCOUNTER — Ambulatory Visit
Admission: EM | Admit: 2021-03-18 | Discharge: 2021-03-18 | Disposition: A | Discharge status: Home / Self Care | Payer: BC Managed Care – PPO | Attending: Physician Assistant | Admitting: Physician Assistant

## 2021-03-18 DIAGNOSIS — J069 Acute upper respiratory infection, unspecified: Secondary | ICD-10-CM

## 2021-03-18 DIAGNOSIS — Z1152 Encounter for screening for COVID-19: Secondary | ICD-10-CM | POA: Diagnosis not present

## 2021-03-18 NOTE — ED Triage Notes (Signed)
Pt c/o pain in chest when coughing x2days. Pt has been around her niece and nephew who were sick. Pt is unsure if they had the flu.  Pt has not taken a covid test. Pt asks to be swabbed for covid.

## 2021-03-18 NOTE — ED Provider Notes (Signed)
[facility]    CSN: [id number] Arrival date & time: 03/18/21  1404      History   Chief Complaint Chief Complaint  Patient presents with   Cough    HPI Ariana Jarvis is a 60 y.o. female.   Patient here today for evaluation of congestion and cough since last 2 days.  She reports that she has developed some pain in her chest with cough.  She has some sore throat as well with cough.  She denies any ear pain.  She has not had known fever.  She denies any vomiting or diarrhea.  She states that she was recently around her grandchildren who have been sick.  The history is provided by the patient.  Cough Associated symptoms: sore throat   Associated symptoms: no chills, no ear pain, no eye discharge, no fever, no shortness of breath and no wheezing    Past Medical History:  Diagnosis Date   Anxiety    Back pain    Gastric ulcer    GERD (gastroesophageal reflux disease)    Hypertension     There are no problems to display for this patient.   Past Surgical History:  Procedure Laterality Date   CESAREAN SECTION     x2    OB History   No obstetric history on file.      Home Medications    Prior to Admission medications   Medication Sig Start Date End Date Taking? Authorizing Provider  citalopram (CELEXA) 40 MG tablet Take 40 mg by mouth daily.   Yes [provider]  COENZYME Q10 PO Take by mouth.   Yes [provider]  HYDROcodone-acetaminophen (NORCO) 10-325 MG tablet Take 1 tablet by mouth every 6 (six) hours as needed.   Yes [provider]  lisinopril-hydrochlorothiazide (ZESTORETIC) 20-12.5 MG tablet Take 1 tablet by mouth daily.   Yes [provider]  Omega-3 Fatty Acids (FISH OIL PO) Take by mouth.   Yes [provider]  omeprazole (PRILOSEC) 40 MG capsule Take 40 mg by mouth daily.   Yes [provider]  solifenacin (VESICARE) 10 MG tablet Take 10 mg by mouth daily. 08/08/19  Yes [provider]  doxycycline (VIBRAMYCIN) 100 MG capsule Take 1 capsule (100 mg total) by mouth 2 (two) times daily. 07/25/20   Bing Neighbors, FNP  gabapentin (NEURONTIN) 300 MG capsule Take 300 mg by mouth 3 (three) times daily.    [provider]  ondansetron (ZOFRAN ODT) 4 MG disintegrating tablet Take 1 tablet (4 mg total) by mouth every 8 (eight) hours as needed for nausea or vomiting. 07/25/20   Bing Neighbors, FNP    Family History Family History  Problem Relation Age of Onset   Hypertension Mother    Hypertension Father    Cancer Father        lung     Social History Social History   Tobacco Use   Smoking status: Never   Smokeless tobacco: Never  Vaping Use   Vaping Use: Never used  Substance Use Topics   Alcohol use: Yes   Drug use: Never     Allergies   Tramadol   Review of Systems Review of Systems  Constitutional:  Negative for chills and fever.  HENT:  Positive for congestion and sore throat. Negative for ear pain.   Eyes:  Negative for discharge and redness.  Respiratory:  Positive for cough. Negative for shortness of breath and wheezing.   Gastrointestinal:  Negative for abdominal pain, diarrhea, nausea and vomiting.    Physical Exam Triage Vital Signs ED Triage Vitals  Enc Vitals Group     BP 03/18/21 1521 137/76     Pulse Rate 03/18/21 1521 77     Resp 03/18/21 1521 18     Temp 03/18/21 1521 97.9 F (36.6 C)     Temp Source 03/18/21 1521 Oral     SpO2 03/18/21 1521 95 %     Weight 03/18/21 1519 230 lb (104.3 kg)     Height 03/18/21 1519 5\' 4"  (1.626 m)     Head Circumference --      Peak Flow --      Pain Score 03/18/21 1518 5     Pain Loc --      Pain Edu? --      Excl. in GC? --    No data found.  Updated Vital Signs BP 137/76 (BP Location: Left Arm)   Pulse 77   Temp 97.9 F (36.6 C) (Oral)   Resp 18   Ht 5\' 4"  (1.626 m)   Wt 230 lb (104.3 kg)   SpO2 95%   BMI 39.48 kg/m   Physical Exam Vitals and nursing  note reviewed.  Constitutional:      General: She is not in acute distress.    Appearance: Normal appearance. She is not ill-appearing.  HENT:     Head: Normocephalic and atraumatic.     Nose: Congestion present.     Mouth/Throat:     Mouth: Mucous membranes are moist.     Pharynx: No oropharyngeal exudate or posterior oropharyngeal erythema.  Eyes:     Conjunctiva/sclera: Conjunctivae normal.  Cardiovascular:     Rate and Rhythm: Normal rate and regular rhythm.     Heart sounds: Normal heart sounds. No murmur heard. Pulmonary:     Effort: Pulmonary effort is normal. No respiratory distress.     Breath sounds: Normal breath sounds. No wheezing, rhonchi or rales.  Skin:    General: Skin is warm and dry.  Neurological:     Mental Status: She is alert.  Psychiatric:        Mood and Affect: Mood normal.        Thought Content: Thought content normal.     UC Treatments / Results  Labs (all labs ordered are listed, but only abnormal results are displayed) Labs Reviewed  COVID-19, FLU A+B NAA    EKG   Radiology No results found.  Procedures Procedures (including critical care time)  Medications Ordered in UC Medications - No data to display  Initial Impression / Assessment and Plan / UC Course  I have reviewed the triage vital signs and the nursing notes.  Pertinent labs & imaging results that were available during my care of the patient were reviewed by me and considered in my medical decision making (see chart for details).    Suspect likely viral etiology of symptoms.  Will order COVID and flu screening.  Recommend symptomatic treatment and increase fluids.  Encouraged follow-up with any further concerns.  Final Clinical Impressions(s) / UC Diagnoses   Final diagnoses:  Acute upper respiratory infection   Discharge Instructions   None    ED Prescriptions   None    PDMP not reviewed this encounter.   14/05/22, PA-C 03/18/21 1559

## 2021-03-19 LAB — COVID-19, FLU A+B NAA
Influenza A, NAA: NOT DETECTED
Influenza B, NAA: NOT DETECTED
SARS-CoV-2, NAA: NOT DETECTED

## 2021-12-26 ENCOUNTER — Emergency Department (HOSPITAL_COMMUNITY)
Admission: EM | Admit: 2021-12-26 | Discharge: 2021-12-27 | Disposition: A | Discharge status: Home / Self Care | Payer: No Typology Code available for payment source | Attending: Student | Admitting: Student

## 2021-12-26 ENCOUNTER — Other Ambulatory Visit: Payer: Self-pay

## 2021-12-26 ENCOUNTER — Emergency Department (HOSPITAL_COMMUNITY): Payer: No Typology Code available for payment source

## 2021-12-26 ENCOUNTER — Encounter (HOSPITAL_COMMUNITY): Payer: Self-pay | Admitting: Emergency Medicine

## 2021-12-26 DIAGNOSIS — Z20822 Contact with and (suspected) exposure to covid-19: Secondary | ICD-10-CM | POA: Insufficient documentation

## 2021-12-26 DIAGNOSIS — R079 Chest pain, unspecified: Secondary | ICD-10-CM | POA: Diagnosis present

## 2021-12-26 DIAGNOSIS — Z5321 Procedure and treatment not carried out due to patient leaving prior to being seen by health care provider: Secondary | ICD-10-CM | POA: Insufficient documentation

## 2021-12-26 DIAGNOSIS — R110 Nausea: Secondary | ICD-10-CM | POA: Insufficient documentation

## 2021-12-26 LAB — URINALYSIS, ROUTINE W REFLEX MICROSCOPIC
Bilirubin Urine: NEGATIVE
Glucose, UA: NEGATIVE mg/dL
Hgb urine dipstick: NEGATIVE
Ketones, ur: NEGATIVE mg/dL
Nitrite: NEGATIVE
Protein, ur: NEGATIVE mg/dL
Specific Gravity, Urine: 1.012 (ref 1.005–1.030)
pH: 8 (ref 5.0–8.0)

## 2021-12-26 LAB — CBC WITH DIFFERENTIAL/PLATELET
Abs Immature Granulocytes: 0.03 10*3/uL (ref 0.00–0.07)
Basophils Absolute: 0 10*3/uL (ref 0.0–0.1)
Basophils Relative: 1 %
Eosinophils Absolute: 0.2 10*3/uL (ref 0.0–0.5)
Eosinophils Relative: 2 %
HCT: 42.6 % (ref 36.0–46.0)
Hemoglobin: 14.3 g/dL (ref 12.0–15.0)
Immature Granulocytes: 0 %
Lymphocytes Relative: 17 %
Lymphs Abs: 1.4 10*3/uL (ref 0.7–4.0)
MCH: 28.9 pg (ref 26.0–34.0)
MCHC: 33.6 g/dL (ref 30.0–36.0)
MCV: 86.1 fL (ref 80.0–100.0)
Monocytes Absolute: 0.4 10*3/uL (ref 0.1–1.0)
Monocytes Relative: 5 %
Neutro Abs: 6.4 10*3/uL (ref 1.7–7.7)
Neutrophils Relative %: 75 %
Platelets: 215 10*3/uL (ref 150–400)
RBC: 4.95 MIL/uL (ref 3.87–5.11)
RDW: 12.9 % (ref 11.5–15.5)
WBC: 8.5 10*3/uL (ref 4.0–10.5)
nRBC: 0 % (ref 0.0–0.2)

## 2021-12-26 LAB — COMPREHENSIVE METABOLIC PANEL
ALT: 79 U/L — ABNORMAL HIGH (ref 0–44)
AST: 106 U/L — ABNORMAL HIGH (ref 15–41)
Albumin: 3.9 g/dL (ref 3.5–5.0)
Alkaline Phosphatase: 102 U/L (ref 38–126)
Anion gap: 11 (ref 5–15)
BUN: 12 mg/dL (ref 6–20)
CO2: 24 mmol/L (ref 22–32)
Calcium: 10.5 mg/dL — ABNORMAL HIGH (ref 8.9–10.3)
Chloride: 102 mmol/L (ref 98–111)
Creatinine, Ser: 1.07 mg/dL — ABNORMAL HIGH (ref 0.44–1.00)
GFR, Estimated: 59 mL/min — ABNORMAL LOW (ref >60)
Glucose, Bld: 148 mg/dL — ABNORMAL HIGH (ref 70–99)
Potassium: 4.3 mmol/L (ref 3.5–5.1)
Sodium: 137 mmol/L (ref 135–145)
Total Bilirubin: 0.8 mg/dL (ref 0.3–1.2)
Total Protein: 6.7 g/dL (ref 6.5–8.1)

## 2021-12-26 LAB — TROPONIN I (HIGH SENSITIVITY): Troponin I (High Sensitivity): 3 ng/L (ref <18)

## 2021-12-26 LAB — LIPASE, BLOOD: Lipase: 24 U/L (ref 11–51)

## 2021-12-26 LAB — SARS CORONAVIRUS 2 BY RT PCR: SARS Coronavirus 2 by RT PCR: NEGATIVE

## 2021-12-26 NOTE — ED Triage Notes (Signed)
Pt reported to ED with c/o nausea, fatigue and flank pain.

## 2021-12-26 NOTE — ED Provider Triage Note (Signed)
Emergency Medicine Provider Triage Evaluation Note  Donyetta Ogletree , a 61 y.o. female  was evaluated in triage.  Pt complains of multiple complaints.  Patient symptoms started today, she feels like she "hurts all over".  Having chest pain which does not radiate anywhere, associate with nausea but no vomiting.  States she feels like she has a UTI.Marland Kitchen  Review of Systems  Per HPI  Physical Exam  There were no vitals taken for this visit. Gen:   Awake, no distress   Resp:  Normal effort  MSK:   Moves extremities without difficulty  Other:    Medical Decision Making  Medically screening exam initiated at 9:42 PM.  Appropriate orders placed.  Alfa Leibensperger was informed that the remainder of the evaluation will be completed by another provider, this initial triage assessment does not replace that evaluation, and the importance of remaining in the ED until their evaluation is complete.     Theron Arista, PA-C 12/26/21 2143

## 2021-12-27 LAB — TROPONIN I (HIGH SENSITIVITY): Troponin I (High Sensitivity): 3 ng/L (ref <18)

## 2021-12-27 NOTE — ED Notes (Signed)
This patient informed this EMT that she was leaving due to extended wait time. This EMT strongly encouraged the patient stay to be seen by a provider due to rooms opening up. This patient stated that she could no longer wait. This EMT told this patient to seek medical attention if her symptoms became worse.

## 2022-01-28 ENCOUNTER — Other Ambulatory Visit: Payer: Self-pay | Admitting: Internal Medicine

## 2022-01-28 DIAGNOSIS — Z1231 Encounter for screening mammogram for malignant neoplasm of breast: Secondary | ICD-10-CM

## 2022-03-07 ENCOUNTER — Other Ambulatory Visit: Payer: Self-pay

## 2022-03-07 ENCOUNTER — Emergency Department (HOSPITAL_COMMUNITY)
Admission: EM | Admit: 2022-03-07 | Discharge: 2022-03-07 | Disposition: A | Discharge status: Home / Self Care | Payer: No Typology Code available for payment source | Attending: Emergency Medicine | Admitting: Emergency Medicine

## 2022-03-07 ENCOUNTER — Encounter (HOSPITAL_COMMUNITY): Payer: Self-pay | Admitting: Emergency Medicine

## 2022-03-07 ENCOUNTER — Emergency Department (HOSPITAL_COMMUNITY): Payer: No Typology Code available for payment source

## 2022-03-07 DIAGNOSIS — S59902A Unspecified injury of left elbow, initial encounter: Secondary | ICD-10-CM | POA: Diagnosis present

## 2022-03-07 DIAGNOSIS — S8292XA Unspecified fracture of left lower leg, initial encounter for closed fracture: Secondary | ICD-10-CM | POA: Diagnosis not present

## 2022-03-07 DIAGNOSIS — S53005A Unspecified dislocation of left radial head, initial encounter: Secondary | ICD-10-CM | POA: Diagnosis not present

## 2022-03-07 DIAGNOSIS — I10 Essential (primary) hypertension: Secondary | ICD-10-CM | POA: Diagnosis not present

## 2022-03-07 DIAGNOSIS — W010XXA Fall on same level from slipping, tripping and stumbling without subsequent striking against object, initial encounter: Secondary | ICD-10-CM | POA: Insufficient documentation

## 2022-03-07 DIAGNOSIS — S5292XA Unspecified fracture of left forearm, initial encounter for closed fracture: Secondary | ICD-10-CM | POA: Diagnosis not present

## 2022-03-07 DIAGNOSIS — Z79899 Other long term (current) drug therapy: Secondary | ICD-10-CM | POA: Diagnosis not present

## 2022-03-07 DIAGNOSIS — S82892A Other fracture of left lower leg, initial encounter for closed fracture: Secondary | ICD-10-CM

## 2022-03-07 DIAGNOSIS — S53115A Anterior dislocation of left ulnohumeral joint, initial encounter: Secondary | ICD-10-CM

## 2022-03-07 LAB — COMPREHENSIVE METABOLIC PANEL
ALT: 18 U/L (ref 0–44)
AST: 24 U/L (ref 15–41)
Albumin: 3.8 g/dL (ref 3.5–5.0)
Alkaline Phosphatase: 93 U/L (ref 38–126)
Anion gap: 9 (ref 5–15)
BUN: 16 mg/dL (ref 8–23)
CO2: 22 mmol/L (ref 22–32)
Calcium: 10.2 mg/dL (ref 8.9–10.3)
Chloride: 107 mmol/L (ref 98–111)
Creatinine, Ser: 0.95 mg/dL (ref 0.44–1.00)
GFR, Estimated: >60 mL/min (ref >60)
Glucose, Bld: 135 mg/dL — ABNORMAL HIGH (ref 70–99)
Potassium: 3.9 mmol/L (ref 3.5–5.1)
Sodium: 138 mmol/L (ref 135–145)
Total Bilirubin: 0.3 mg/dL (ref 0.3–1.2)
Total Protein: 6.3 g/dL — ABNORMAL LOW (ref 6.5–8.1)

## 2022-03-07 LAB — CBC WITH DIFFERENTIAL/PLATELET
Abs Immature Granulocytes: 0.04 10*3/uL (ref 0.00–0.07)
Basophils Absolute: 0.1 10*3/uL (ref 0.0–0.1)
Basophils Relative: 0 %
Eosinophils Absolute: 0.3 10*3/uL (ref 0.0–0.5)
Eosinophils Relative: 2 %
HCT: 39.7 % (ref 36.0–46.0)
Hemoglobin: 13.8 g/dL (ref 12.0–15.0)
Immature Granulocytes: 0 %
Lymphocytes Relative: 19 %
Lymphs Abs: 2.3 10*3/uL (ref 0.7–4.0)
MCH: 29.8 pg (ref 26.0–34.0)
MCHC: 34.8 g/dL (ref 30.0–36.0)
MCV: 85.7 fL (ref 80.0–100.0)
Monocytes Absolute: 0.8 10*3/uL (ref 0.1–1.0)
Monocytes Relative: 7 %
Neutro Abs: 8.8 10*3/uL — ABNORMAL HIGH (ref 1.7–7.7)
Neutrophils Relative %: 72 %
Platelets: 190 10*3/uL (ref 150–400)
RBC: 4.63 MIL/uL (ref 3.87–5.11)
RDW: 12.6 % (ref 11.5–15.5)
WBC: 12.2 10*3/uL — ABNORMAL HIGH (ref 4.0–10.5)
nRBC: 0 % (ref 0.0–0.2)

## 2022-03-07 LAB — I-STAT BETA HCG BLOOD, ED (MC, WL, AP ONLY): I-stat hCG, quantitative: <5 m[IU]/mL (ref <5)

## 2022-03-07 MED ORDER — PROPOFOL 10 MG/ML IV BOLUS
200.0000 mg | Freq: Once | INTRAVENOUS | Status: AC
  Administered 2022-03-07: 200 mg via INTRAVENOUS
  Filled 2022-03-07: qty 20

## 2022-03-07 MED ORDER — LIDOCAINE HCL 2 % IJ SOLN
10.0000 mL | Freq: Once | INTRAMUSCULAR | Status: AC
  Administered 2022-03-07: 200 mg
  Filled 2022-03-07: qty 20

## 2022-03-07 MED ORDER — PROPOFOL 10 MG/ML IV BOLUS
INTRAVENOUS | Status: AC | PRN
  Administered 2022-03-07: 150 ug via INTRAVENOUS

## 2022-03-07 MED ORDER — OXYCODONE HCL 5 MG PO TABS: 5.0000 mg | ORAL_TABLET | Freq: Four times a day (QID) | ORAL | 0 refills | Status: AC | PRN

## 2022-03-07 MED ORDER — ONDANSETRON HCL 4 MG/2ML IJ SOLN
4.0000 mg | Freq: Once | INTRAMUSCULAR | Status: AC
  Administered 2022-03-07: 4 mg via INTRAVENOUS
  Filled 2022-03-07: qty 2

## 2022-03-07 MED ORDER — FENTANYL CITRATE PF 50 MCG/ML IJ SOSY
50.0000 ug | PREFILLED_SYRINGE | Freq: Once | INTRAMUSCULAR | Status: AC
  Administered 2022-03-07: 50 ug via INTRAVENOUS
  Filled 2022-03-07: qty 1

## 2022-03-07 NOTE — ED Provider Notes (Signed)
[facility] EMERGENCY DEPARTMENT Provider Note   CSN: [id number] Arrival date & time: 03/07/22  1611     History  Chief Complaint  Patient presents with   Marletta Lor    Ariana Jarvis is a 61 y.o. female.  With past medical history significant for HTN, GERD, presenting via EMS after a fall at the gas station earlier today.  Patient tripped and fell on her left side.  She had pain to her left elbow and left ankle.  She did not hit her head or lose consciousness.  She does not take blood thinning medications.  She endorses pain in her left elbow and left ankle.  She denies numbness, tingling.  She denies chest pain, shortness of breath, headache, lightheadedness, dizziness.  The history is provided by the patient, the EMS personnel and a relative.       Home Medications Prior to Admission medications   Medication Sig Start Date End Date Taking? Authorizing Provider  oxyCODONE (ROXICODONE) 5 MG immediate release tablet Take 1 tablet (5 mg total) by mouth every 6 (six) hours as needed for up to 2 days for severe pain or breakthrough pain. 03/07/22 03/09/22 Yes Linward Foster, MD  citalopram (CELEXA) 40 MG tablet Take 40 mg by mouth daily.    [provider]  COENZYME Q10 PO Take by mouth.    [provider]  doxycycline (VIBRAMYCIN) 100 MG capsule Take 1 capsule (100 mg total) by mouth 2 (two) times daily. 07/25/20   Bing Neighbors, FNP  gabapentin (NEURONTIN) 300 MG capsule Take 300 mg by mouth 3 (three) times daily.    [provider]  HYDROcodone-acetaminophen (NORCO) 10-325 MG tablet Take 1 tablet by mouth every 6 (six) hours as needed.    [provider]  lisinopril-hydrochlorothiazide (ZESTORETIC) 20-12.5 MG tablet Take 1 tablet by mouth daily.    [provider]  Omega-3 Fatty Acids (FISH OIL PO) Take by mouth.    [provider]  omeprazole (PRILOSEC) 40 MG capsule Take 40 mg by mouth daily.    [provider]  ondansetron (ZOFRAN ODT) 4 MG disintegrating tablet Take 1 tablet (4 mg total) by mouth every 8 (eight) hours as needed for nausea or vomiting. 07/25/20   Bing Neighbors, FNP  solifenacin (VESICARE) 10 MG tablet Take 10 mg by mouth daily. 08/08/19   [provider]      Allergies    Tramadol    Review of Systems   Review of Systems as noted in HPI  Physical Exam Updated Vital Signs BP 131/62   Pulse 63   Temp 98 F (36.7 C) (Oral)   Resp 14   Ht  (1.626 m)   Wt 104.3 kg   SpO2 100%   BMI 39.48 kg/m  Physical Exam Vitals and nursing note reviewed.  Constitutional:      General: She is not in acute distress.    Appearance: She is not ill-appearing, toxic-appearing or diaphoretic.  HENT:     Head: Normocephalic and atraumatic.     Nose: Nose normal.     Mouth/Throat:     Mouth: Mucous membranes are moist.     Pharynx: Oropharynx is clear.  Eyes:     Extraocular Movements: Extraocular movements intact.  Cardiovascular:     Rate and Rhythm: Normal rate and regular rhythm.     Pulses: Normal pulses.     Heart sounds: Normal heart sounds.  Abdominal:     General:  There is no distension.     Tenderness: There is no abdominal tenderness. There is no guarding or rebound.  Musculoskeletal:        General: Tenderness and signs of injury present.     Left elbow: Deformity present. Decreased range of motion. Tenderness present.     Cervical back: Normal range of motion. No tenderness.     Left ankle: Deformity and ecchymosis present. Tenderness present. Decreased range of motion.     Comments: Radial pulse intact, sensation in hands intact, able to move fingers.  DP intact, sensation in toes intact, able to wiggle toes  Skin:    General: Skin is warm and dry.  Neurological:     Mental Status: She is alert and oriented to person, place, and time.     ED Results / Procedures / Treatments   Labs (all labs ordered are listed, but only abnormal  results are displayed) Labs Reviewed  CBC WITH DIFFERENTIAL/PLATELET - Abnormal; Notable for the following components:      Result Value   WBC 12.2 (*)    Neutro Abs 8.8 (*)    All other components within normal limits  COMPREHENSIVE METABOLIC PANEL - Abnormal; Notable for the following components:   Glucose, Bld 135 (*)    Total Protein 6.3 (*)    All other components within normal limits  I-STAT BETA HCG BLOOD, ED ([facility], [facility], [facility] ONLY)    EKG None  Radiology DG Ankle Left Port  Result Date: 03/07/2022 CLINICAL DATA:  Postreduction EXAM: PORTABLE LEFT ANKLE - 2 VIEW COMPARISON:  Radiographs earlier today at 4:59 p.m. FINDINGS: Interval reduction of the previous talar dislocation. There is residual slight widening of the medial ankle mortise. Redemonstrated comminuted fracture of the distal left fibular metadiaphysis extending into the tibiofibular syndesmosis. Soft tissue swelling about the ankle. Casting material about the ankle. IMPRESSION: 1. Interval reduction of the left talus dislocation with residual widening of the medial ankle mortise. 2. Improved alignment of the distal left fibula fracture. Electronically Signed   By: Minerva Festeryler  Stutzman M.D.   On: 03/07/2022 19:27   DG Elbow 2 Views Left  Result Date: 03/07/2022 CLINICAL DATA:  Postreduction EXAM: LEFT ELBOW - 2 VIEW COMPARISON:  Radiographs earlier today at 5:02 p.m. FINDINGS: Interval reduction of the previous elbow dislocation. Impacted mildly comminuted and displaced intra-articular fracture of the left radial head is redemonstrated. Widening at the radiocapitellar joint. Probable joint effusion. Soft tissue swelling. IMPRESSION: Interval reduction of the elbow dislocation. Residual widening of the radiocapitellar joint. Redemonstrated radial head fracture. Electronically Signed   By: Minerva Festeryler  Stutzman M.D.   On: 03/07/2022 19:25   DG Ankle Complete Left  Result Date: 03/07/2022 CLINICAL DATA:  Trip and fall, pain EXAM: LEFT  ANKLE COMPLETE - 3+ VIEW; LEFT TIBIA AND FIBULA - 2 VIEW COMPARISON:  None Available. FINDINGS: Minimally displaced fracture of the left fibular neck. No fracture of the proximal tibia. Grossly displaced, angulated and impacted fracture of the left lateral malleolus, with widening of the ankle mortise. Diffuse soft tissue edema about the calf and ankle. IMPRESSION: 1. Minimally displaced fracture of the left fibular neck. No fracture of the proximal tibia. 2. Grossly displaced, angulated and impacted fracture of the left lateral malleolus, with widening of the ankle mortise. 3. Diffuse soft tissue edema about the calf and ankle. Electronically Signed   By: Jearld LeschAlex D Bibbey M.D.   On: 03/07/2022 17:28   DG Tibia/Fibula Left  Result Date: 03/07/2022 CLINICAL DATA:  Trip and fall, pain EXAM: LEFT ANKLE COMPLETE - 3+ VIEW; LEFT TIBIA AND FIBULA - 2 VIEW COMPARISON:  None Available. FINDINGS: Minimally displaced fracture of the left fibular neck. No fracture of the proximal tibia. Grossly displaced, angulated and impacted fracture of the left lateral malleolus, with widening of the ankle mortise. Diffuse soft tissue edema about the calf and ankle. IMPRESSION: 1. Minimally displaced fracture of the left fibular neck. No fracture of the proximal tibia. 2. Grossly displaced, angulated and impacted fracture of the left lateral malleolus, with widening of the ankle mortise. 3. Diffuse soft tissue edema about the calf and ankle. Electronically Signed   By: Jearld Lesch M.D.   On: 03/07/2022 17:28   DG Humerus Left  Result Date: 03/07/2022 CLINICAL DATA:  Fall, left elbow pain EXAM: LEFT HUMERUS - 2+ VIEW; LEFT FOREARM - 2 VIEW COMPARISON:  None Available. FINDINGS: Posteriorly displaced fracture of the left elbow, with associated left radial head fracture. No other evidence of humeral, ulnar, or radial fracture. Diffuse soft tissue edema about the elbow. IMPRESSION: 1. Posteriorly displaced fracture of the left elbow,  with associated left radial head fracture. No other evidence of humeral, ulnar, or radial fracture. 2.  Diffuse soft tissue edema about the elbow. Electronically Signed   By: Jearld Lesch M.D.   On: 03/07/2022 17:25   DG Forearm Left  Result Date: 03/07/2022 CLINICAL DATA:  Fall, left elbow pain EXAM: LEFT HUMERUS - 2+ VIEW; LEFT FOREARM - 2 VIEW COMPARISON:  None Available. FINDINGS: Posteriorly displaced fracture of the left elbow, with associated left radial head fracture. No other evidence of humeral, ulnar, or radial fracture. Diffuse soft tissue edema about the elbow. IMPRESSION: 1. Posteriorly displaced fracture of the left elbow, with associated left radial head fracture. No other evidence of humeral, ulnar, or radial fracture. 2.  Diffuse soft tissue edema about the elbow. Electronically Signed   By: Jearld Lesch M.D.   On: 03/07/2022 17:25    Procedures .Sedation  Date/Time: 03/07/2022 7:14 PM  Performed by: Linward Foster, MD Authorized by: Charlynne Pander, MD   Consent:    Consent obtained:  Written   Consent given by:  Patient   Risks discussed:  Allergic reaction, dysrhythmia, inadequate sedation, nausea, vomiting, respiratory compromise necessitating ventilatory assistance and intubation, prolonged sedation necessitating reversal and prolonged hypoxia resulting in organ damage   Alternatives discussed:  Analgesia without sedation and anxiolysis Universal protocol:    Procedure explained and questions answered to patient or proxy's satisfaction: yes     Relevant documents present and verified: yes     Test results available: yes     Imaging studies available: yes     Required blood products, implants, devices, and special equipment available: yes     Site/side marked: yes     Immediately prior to procedure, a time out was called: yes     Patient identity confirmed:  Verbally with patient and arm band Indications:    Procedure performed:  Fracture reduction   Procedure  necessitating sedation performed by:  Physician performing sedation Pre-sedation assessment:    Time since last food or drink:  6 hours   ASA classification: class 1 - normal, healthy patient     Mouth opening:  3 or more finger widths   Thyromental distance:  3 finger widths   Mallampati score:  I - soft palate, uvula, fauces, pillars visible   Neck mobility: normal     Pre-sedation assessments completed and reviewed:  airway patency, cardiovascular function, hydration status, mental status, nausea/vomiting, pain level, respiratory function and temperature     Pre-sedation assessment completed:  03/07/2022 6:25 PM Immediate pre-procedure details:    Reassessment: Patient reassessed immediately prior to procedure     Reviewed: vital signs, relevant labs/tests and NPO status     Verified: bag valve mask available, emergency equipment available, intubation equipment available, IV patency confirmed, oxygen available, reversal medications available and suction available   Procedure details (see MAR for exact dosages):    Preoxygenation:  Nasal cannula   Sedation:  Propofol   Intended level of sedation: moderate (conscious sedation)   Analgesia:  None   Intra-procedure monitoring:  Blood pressure monitoring, cardiac monitor, continuous capnometry, continuous pulse oximetry, frequent LOC assessments and frequent vital sign checks   Intra-procedure events: none     Total Provider sedation time (minutes):  30 Post-procedure details:    Post-sedation assessment completed:  03/07/2022 7:19 PM   Attendance: Constant attendance by certified staff until patient recovered     Recovery: Patient returned to pre-procedure baseline     Post-sedation assessments completed and reviewed: airway patency, cardiovascular function, hydration status, mental status, nausea/vomiting, pain level, respiratory function and temperature     Patient is stable for discharge or admission: yes     Procedure completion:   Tolerated well, no immediate complications .Ortho Injury Treatment  Date/Time: 03/07/2022 7:21 PM  Performed by: Linward Foster, MD Authorized by: Charlynne Pander, MD   Consent:    Consent obtained:  Written   Risks discussed:  Fracture, irreducible dislocation, recurrent dislocation, nerve damage, restricted joint movement, stiffness and vascular damage   Alternatives discussed:  No treatment, immobilization and delayed treatmentInjury location: elbow Location details: left elbow Injury type: dislocation Dislocation type: posterior Pre-procedure neurovascular assessment: neurovascularly intact Pre-procedure distal perfusion: normal Pre-procedure neurological function: normal Pre-procedure range of motion: reduced  Anesthesia: Local anesthesia used: no  Patient sedated: Yes. Refer to sedation procedure documentation for details of sedation. Manipulation performed: yes Reduction method: traction and counter traction and manipulation of proximal ulna Reduction successful: yes X-ray confirmed reduction: yes Immobilization: splint and sling Splint type: long arm Splint Applied by: Ortho Tech Supplies used: cotton padding, elastic bandage and Ortho-Glass Post-procedure neurovascular assessment: post-procedure neurovascularly intact Post-procedure distal perfusion: normal Post-procedure neurological function: normal Post-procedure range of motion: improved   .Ortho Injury Treatment  Date/Time: 03/07/2022 7:24 PM  Performed by: Linward Foster, MD Authorized by: Charlynne Pander, MD   Consent:    Consent obtained:  Written   Consent given by:  Patient   Risks discussed:  Fracture   Alternatives discussed:  No treatment, delayed treatment and immobilizationInjury location: ankle Location details: left ankle Injury type: fracture-dislocation Fracture type: trimalleolar Pre-procedure neurovascular assessment: neurovascularly intact Pre-procedure distal perfusion:  normal Pre-procedure neurological function: normal Pre-procedure range of motion: reduced Anesthesia: local infiltration  Anesthesia: Local anesthesia used: yes Local Anesthetic: lidocaine 2% without epinephrine Anesthetic total: 10 mL  Patient sedated: Yes. Refer to sedation procedure documentation for details of sedation. Manipulation performed: yes Reduction successful: yes X-ray confirmed reduction: yes Immobilization: splint Splint type: cadillac. Splint Applied by: Milon Dikes Supplies used: cotton padding and Ortho-Glass Post-procedure neurovascular assessment: post-procedure neurovascularly intact Post-procedure distal perfusion: normal Post-procedure neurological function: normal Post-procedure range of motion: improved       Medications Ordered in ED Medications  fentaNYL (SUBLIMAZE) injection 50 mcg (50 mcg Intravenous Given 03/07/22 1641)  ondansetron (ZOFRAN) injection 4 mg (4 mg Intravenous Given  03/07/22 1641)  propofol (DIPRIVAN) 10 mg/mL bolus/IV push 200 mg (200 mg Intravenous Given 03/07/22 1845)  lidocaine (XYLOCAINE) 2 % (with pres) injection 200 mg (200 mg Infiltration Given 03/07/22 1846)  propofol (DIPRIVAN) 10 mg/mL bolus/IV push (150 mcg Intravenous Given 03/07/22 1845)    ED Course/ Medical Decision Making/ A&P                           Medical Decision Making Patient presents with acute traumatic injuries to her left elbow left ankle.  There are obvious deformities.  She is neurovascularly intact.  Not hit her head or lose consciousness.  Description of fall is mechanical in nature.  Not on blood thinners.  Imaging of left upper extremity and left lower extremity ordered.  Screening labs ordered.  Amount and/or Complexity of Data Reviewed Independent Historian: EMS Labs: ordered. Decision-making details documented in ED Course. Radiology: ordered and independent interpretation performed. Decision-making details documented in ED  Course.  Risk Prescription drug management.   I personally viewed and interpreted the x-rays.  I agree with radiologic interpretation.  Patient's left elbow is dislocated posteriorly.  Radial head fracture also noted.  No fracture on humerus appreciated.  Left ankle x-ray consistent with trimalleolar fracture.  Discussed the patient with orthopedist, Dr. Blanchie Dessert, who was able to review the images.  We reduced the elbow and ankle as noted in procedure note above.  Dr. Blanchie Dessert was able to view the patient's post reduction images.  He noted she will be able to follow-up with him in clinic on Tuesday and they will evaluate her as well as discuss operative intervention of the ankle.  On reevaluation patient, she did have continued discomfort.  Patient is prescribed oxycodone 10 mg tablets for chronic pain.  She has not followed by pain clinic.  I discussed with her symptomatic management of her pain including baseline pain control with Tylenol and ibuprofen.  I prescribed her a short course of 5 mg tablets of oxycodone to prevent her running out of her baseline oxycodone.  I counseled her and family numbers at bedside in regards to dangers of overdose, the importance of avoiding alcohol.  Patient will follow-up with orthopedist.  I gave family contact information.  She will be nonweightbearing to left upper extremity and left lower extremity.  Order for wheelchair placed.  Consult to social work for assistance in mornings wheelchair to be delivered tomorrow.  I updated the family with these plans.  Patient will require assistance in transportation due to her nonweightbearing status.  Nursing contacted [facility].  Strict return precautions given and discussed with patient and family bedside including signs and symptoms of compartment syndrome.        Final Clinical Impression(s) / ED Diagnoses Final diagnoses:  Closed fracture of left ankle, initial encounter  Anterior dislocation of left elbow,  initial encounter  Closed fracture of left forearm, initial encounter    Rx / DC Orders ED Discharge Orders          Ordered    For home use only DME standard manual wheelchair with seat cushion       Comments: Patient suffers from ankle and elbow fractures which impairs their ability to perform daily activities like bathing, dressing, and feeding in the home.  A walker will not resolve issue with performing activities of daily living. A wheelchair will allow patient to safely perform daily activities. Patient can safely propel the wheelchair in the home  or has a caregiver who can provide assistance. Length of need 6 months . Accessories: elevating leg rests (ELRs), wheel locks, extensions and anti-tippers.   03/07/22 1751    Home Health        03/07/22 2212    Face-to-face encounter (required for Medicare/Medicaid patients)       Comments: I Maia Plan certify that this patient is under my care and that I, or a nurse practitioner or physician's assistant working with me, had a face-to-face encounter that meets the physician face-to-face encounter requirements with this patient on 03/07/2022. The encounter with the patient was in whole, or in part for the following medical condition(s) which is the primary reason for home health care (List medical condition): Fracture of upper extremity and fracture of lower extremity.  She is nonweightbearing.  Due to injury to upper extremity is unable to use crutches and requires wheelchair use as she is nonweightbearing to the fracture in her lower extremity.  She requires assistance from home health aide   03/07/22 2212    oxyCODONE (ROXICODONE) 5 MG immediate release tablet  Every 6 hours PRN        03/07/22 2242              Linward Foster, MD 03/08/22 0040    Charlynne Pander, MD 03/08/22 [phone]

## 2022-03-07 NOTE — ED Notes (Signed)
X-ray at bedside

## 2022-03-07 NOTE — Social Work (Signed)
Patient will need a walker, we will have to get one in the AM. TOC will continue to follow.

## 2022-03-07 NOTE — Discharge Instructions (Addendum)
You are seen today after he tripped and fell at the gas station.  You had a dislocation and fracture of your left elbow as well as dislocation and fracture of your left ankle.  We reduced the dislocations and splinted these.  We spoke with orthopedics who will see you in their office on Tuesday.  I have put the contact information for his office above.    Do not put weight on your foot.  Do not use your arm.  We have provided you with a sling for your arm.  Our social worker has assisted in helping have a wheelchair delivered to your house tomorrow.  I have placed a referral for home health to come to help you with figuring out how to get around her house and bathe.  Please use acetaminophen and ibuprofen for baseline pain control.  You can take 400 mg of ibuprofen every 4 hours and you can take 650 mg of acetaminophen every 4 hours.  Do not take more than 4000 mg of acetaminophen in a 24-hour period.  If you have severe increase in pain, any changes in color in your left hand or left foot, any decrease sensation such as numbness or tingling in your hand or foot that does not resolve with repositioning, please return to the emergency department immediately.  Elevate your elbow and foot as often as you can. You can use ice for discomfort through your splints. Do not get your splints wet.  You can use your oxycodone for breakthrough pain.  Do not drink alcohol while taking the oxycodone.  Take a stool softener while you are taking the oxycodone.

## 2022-03-07 NOTE — ED Notes (Signed)
Discharge instructions reviewed with pt and pt's brother. Pt verbalizes understanding. Pt taken home via ambulance. Belongings with pt upon discharge.

## 2022-03-07 NOTE — Sedation Documentation (Signed)
Timeout at [facility]

## 2022-03-07 NOTE — ED Triage Notes (Signed)
Pt BIB [facility], reported patient was at the gas station tripped over the gas cord. EMS report dislocation left elbow and ankle deformity. Pt did not lost consciousness, did not hit head and not on thinners.

## 2022-03-07 NOTE — Progress Notes (Signed)
Orthopedic Tech Progress Note Patient Details:  Ariana Jarvis [birth year] [id number]  Ortho Devices Type of Ortho Device: Long arm splint, Post (short leg) splint, Stirrup splint Ortho Device/Splint Location: LUE/LLE Ortho Device/Splint Interventions: Ordered, Application, Adjustment   Post Interventions Patient Tolerated: Well Instructions Provided: Care of device Splints applied following reduction of LUE/LLE. [name] 03/07/2022, 7:12 PM

## 2022-03-11 ENCOUNTER — Encounter (HOSPITAL_BASED_OUTPATIENT_CLINIC_OR_DEPARTMENT_OTHER): Payer: Self-pay | Admitting: Orthopedic Surgery

## 2022-03-11 ENCOUNTER — Other Ambulatory Visit: Payer: Self-pay

## 2022-03-14 ENCOUNTER — Ambulatory Visit (HOSPITAL_BASED_OUTPATIENT_CLINIC_OR_DEPARTMENT_OTHER)
Admission: RE | Admit: 2022-03-14 | Discharge: 2022-03-14 | Disposition: A | Discharge status: Home / Self Care | Payer: No Typology Code available for payment source | Source: Ambulatory Visit | Attending: Orthopedic Surgery | Admitting: Orthopedic Surgery

## 2022-03-14 ENCOUNTER — Ambulatory Visit (HOSPITAL_BASED_OUTPATIENT_CLINIC_OR_DEPARTMENT_OTHER): Payer: No Typology Code available for payment source | Admitting: Certified Registered"

## 2022-03-14 ENCOUNTER — Encounter (HOSPITAL_BASED_OUTPATIENT_CLINIC_OR_DEPARTMENT_OTHER)
Admission: RE | Disposition: A | Discharge status: Home / Self Care | Payer: Self-pay | Source: Ambulatory Visit | Attending: Orthopedic Surgery

## 2022-03-14 ENCOUNTER — Ambulatory Visit (HOSPITAL_COMMUNITY): Payer: No Typology Code available for payment source

## 2022-03-14 ENCOUNTER — Ambulatory Visit (HOSPITAL_BASED_OUTPATIENT_CLINIC_OR_DEPARTMENT_OTHER): Payer: No Typology Code available for payment source

## 2022-03-14 ENCOUNTER — Other Ambulatory Visit: Payer: Self-pay

## 2022-03-14 ENCOUNTER — Encounter (HOSPITAL_BASED_OUTPATIENT_CLINIC_OR_DEPARTMENT_OTHER): Payer: Self-pay | Admitting: Orthopedic Surgery

## 2022-03-14 ENCOUNTER — Encounter (HOSPITAL_COMMUNITY): Payer: Self-pay | Admitting: Orthopedic Surgery

## 2022-03-14 DIAGNOSIS — F32A Depression, unspecified: Secondary | ICD-10-CM | POA: Diagnosis not present

## 2022-03-14 DIAGNOSIS — S52042A Displaced fracture of coronoid process of left ulna, initial encounter for closed fracture: Secondary | ICD-10-CM

## 2022-03-14 DIAGNOSIS — K219 Gastro-esophageal reflux disease without esophagitis: Secondary | ICD-10-CM | POA: Diagnosis not present

## 2022-03-14 DIAGNOSIS — Z6841 Body Mass Index (BMI) 40.0 and over, adult: Secondary | ICD-10-CM | POA: Diagnosis not present

## 2022-03-14 DIAGNOSIS — F419 Anxiety disorder, unspecified: Secondary | ICD-10-CM | POA: Insufficient documentation

## 2022-03-14 DIAGNOSIS — S52122A Displaced fracture of head of left radius, initial encounter for closed fracture: Secondary | ICD-10-CM | POA: Insufficient documentation

## 2022-03-14 DIAGNOSIS — Z01818 Encounter for other preprocedural examination: Secondary | ICD-10-CM

## 2022-03-14 DIAGNOSIS — W19XXXA Unspecified fall, initial encounter: Secondary | ICD-10-CM | POA: Diagnosis not present

## 2022-03-14 DIAGNOSIS — I10 Essential (primary) hypertension: Secondary | ICD-10-CM | POA: Insufficient documentation

## 2022-03-14 DIAGNOSIS — S82842A Displaced bimalleolar fracture of left lower leg, initial encounter for closed fracture: Secondary | ICD-10-CM

## 2022-03-14 DIAGNOSIS — Y92524 Gas station as the place of occurrence of the external cause: Secondary | ICD-10-CM | POA: Diagnosis not present

## 2022-03-14 HISTORY — PX: ORIF ANKLE FRACTURE: SHX5408

## 2022-03-14 HISTORY — DX: Unspecified osteoarthritis, unspecified site: M19.90

## 2022-03-14 HISTORY — PX: ORIF RADIAL FRACTURE: SHX5113

## 2022-03-14 HISTORY — DX: Other chronic pain: G89.29

## 2022-03-14 HISTORY — DX: Depression, unspecified: F32.A

## 2022-03-14 LAB — SURGICAL PCR SCREEN
MRSA, PCR: POSITIVE — AB
Staphylococcus aureus: POSITIVE — AB

## 2022-03-14 SURGERY — OPEN REDUCTION INTERNAL FIXATION (ORIF) ANKLE FRACTURE
Anesthesia: General | Site: Arm Lower | Laterality: Left

## 2022-03-14 MED ORDER — MIDAZOLAM HCL 2 MG/2ML IJ SOLN: 2.0000 mg | Freq: Once | INTRAMUSCULAR | Status: DC

## 2022-03-14 MED ORDER — FENTANYL CITRATE (PF) 100 MCG/2ML IJ SOLN: 100.0000 ug | Freq: Once | INTRAMUSCULAR | Status: DC

## 2022-03-14 MED ORDER — HYDROMORPHONE HCL 1 MG/ML IJ SOLN
INTRAMUSCULAR | Status: AC
  Filled 2022-03-14: qty 0.5

## 2022-03-14 MED ORDER — BUPIVACAINE HCL (PF) 0.5 % IJ SOLN
INTRAMUSCULAR | Status: DC | PRN
  Administered 2022-03-14: 15 mL via PERINEURAL

## 2022-03-14 MED ORDER — 0.9 % SODIUM CHLORIDE (POUR BTL) OPTIME
TOPICAL | Status: DC | PRN
  Administered 2022-03-14: 1000 mL

## 2022-03-14 MED ORDER — FENTANYL CITRATE (PF) 100 MCG/2ML IJ SOLN
100.0000 ug | Freq: Once | INTRAMUSCULAR | Status: AC
  Administered 2022-03-14: 100 ug via INTRAVENOUS

## 2022-03-14 MED ORDER — ACETAMINOPHEN 500 MG PO TABS: 1000.0000 mg | ORAL_TABLET | Freq: Three times a day (TID) | ORAL | 0 refills | Status: AC

## 2022-03-14 MED ORDER — ACETAMINOPHEN 500 MG PO TABS: 1000.0000 mg | ORAL_TABLET | Freq: Once | ORAL | Status: AC

## 2022-03-14 MED ORDER — VANCOMYCIN HCL 1000 MG IV SOLR
INTRAVENOUS | Status: DC | PRN
  Administered 2022-03-14: 1000 mg via INTRAVENOUS

## 2022-03-14 MED ORDER — ONDANSETRON HCL 4 MG/2ML IJ SOLN
INTRAMUSCULAR | Status: AC
  Filled 2022-03-14: qty 2

## 2022-03-14 MED ORDER — GABAPENTIN 300 MG PO CAPS
300.0000 mg | ORAL_CAPSULE | Freq: Once | ORAL | Status: AC
  Administered 2022-03-14: 300 mg via ORAL

## 2022-03-14 MED ORDER — FENTANYL CITRATE (PF) 100 MCG/2ML IJ SOLN
INTRAMUSCULAR | Status: AC
  Filled 2022-03-14: qty 2

## 2022-03-14 MED ORDER — FENTANYL CITRATE (PF) 100 MCG/2ML IJ SOLN
INTRAMUSCULAR | Status: DC | PRN
  Administered 2022-03-14: 25 ug via INTRAVENOUS
  Administered 2022-03-14: 50 ug via INTRAVENOUS
  Administered 2022-03-14: 25 ug via INTRAVENOUS

## 2022-03-14 MED ORDER — DEXAMETHASONE SODIUM PHOSPHATE 10 MG/ML IJ SOLN
INTRAMUSCULAR | Status: AC
  Filled 2022-03-14: qty 1

## 2022-03-14 MED ORDER — OXYCODONE HCL 5 MG PO TABS: 5.0000 mg | ORAL_TABLET | Freq: Once | ORAL | Status: DC

## 2022-03-14 MED ORDER — MIDAZOLAM HCL 2 MG/2ML IJ SOLN
2.0000 mg | Freq: Once | INTRAMUSCULAR | Status: AC
  Administered 2022-03-14: 2 mg via INTRAVENOUS

## 2022-03-14 MED ORDER — MIDAZOLAM HCL 2 MG/2ML IJ SOLN
INTRAMUSCULAR | Status: AC
  Filled 2022-03-14: qty 2

## 2022-03-14 MED ORDER — RIVAROXABAN 10 MG PO TABS: 10.0000 mg | ORAL_TABLET | Freq: Every day | ORAL | 0 refills | Status: AC

## 2022-03-14 MED ORDER — CELECOXIB 200 MG PO CAPS
ORAL_CAPSULE | ORAL | Status: AC
  Filled 2022-03-14: qty 1

## 2022-03-14 MED ORDER — VANCOMYCIN HCL 1000 MG IV SOLR
INTRAVENOUS | Status: AC
  Filled 2022-03-14: qty 20

## 2022-03-14 MED ORDER — ONDANSETRON HCL 4 MG/2ML IJ SOLN
INTRAMUSCULAR | Status: DC | PRN
  Administered 2022-03-14: 4 mg via INTRAVENOUS

## 2022-03-14 MED ORDER — PROPOFOL 10 MG/ML IV BOLUS
INTRAVENOUS | Status: AC
  Filled 2022-03-14: qty 20

## 2022-03-14 MED ORDER — BUPIVACAINE HCL (PF) 0.25 % IJ SOLN
INTRAMUSCULAR | Status: AC
  Filled 2022-03-14: qty 30

## 2022-03-14 MED ORDER — HYDROMORPHONE HCL 1 MG/ML IJ SOLN
0.5000 mg | INTRAMUSCULAR | Status: DC | PRN
  Administered 2022-03-14: 0.5 mg via INTRAVENOUS

## 2022-03-14 MED ORDER — BUPIVACAINE LIPOSOME 1.3 % IJ SUSP
INTRAMUSCULAR | Status: DC | PRN
  Administered 2022-03-14: 10 mL via PERINEURAL

## 2022-03-14 MED ORDER — GABAPENTIN 300 MG PO CAPS
ORAL_CAPSULE | ORAL | Status: AC
  Filled 2022-03-14: qty 1

## 2022-03-14 MED ORDER — AMISULPRIDE (ANTIEMETIC) 5 MG/2ML IV SOLN: 10.0000 mg | Freq: Once | INTRAVENOUS | Status: DC | PRN

## 2022-03-14 MED ORDER — LIDOCAINE HCL (CARDIAC) PF 100 MG/5ML IV SOSY
PREFILLED_SYRINGE | INTRAVENOUS | Status: DC | PRN
  Administered 2022-03-14: 40 mg via INTRAVENOUS

## 2022-03-14 MED ORDER — LIDOCAINE 2% (20 MG/ML) 5 ML SYRINGE
INTRAMUSCULAR | Status: AC
  Filled 2022-03-14: qty 5

## 2022-03-14 MED ORDER — EPHEDRINE SULFATE (PRESSORS) 50 MG/ML IJ SOLN
INTRAMUSCULAR | Status: DC | PRN
  Administered 2022-03-14: 5 mg via INTRAVENOUS

## 2022-03-14 MED ORDER — DEXAMETHASONE SODIUM PHOSPHATE 10 MG/ML IJ SOLN
INTRAMUSCULAR | Status: DC | PRN
  Administered 2022-03-14: 5 mg via INTRAVENOUS

## 2022-03-14 MED ORDER — ONDANSETRON HCL 4 MG PO TABS: 4.0000 mg | ORAL_TABLET | Freq: Three times a day (TID) | ORAL | 0 refills | Status: AC | PRN

## 2022-03-14 MED ORDER — LACTATED RINGERS IV SOLN: INTRAVENOUS | Status: DC

## 2022-03-14 MED ORDER — CELECOXIB 200 MG PO CAPS
200.0000 mg | ORAL_CAPSULE | Freq: Once | ORAL | Status: AC
  Administered 2022-03-14: 200 mg via ORAL

## 2022-03-14 MED ORDER — CEFAZOLIN SODIUM-DEXTROSE 2-4 GM/100ML-% IV SOLN
2.0000 g | INTRAVENOUS | Status: AC
  Administered 2022-03-14: 2 g via INTRAVENOUS

## 2022-03-14 MED ORDER — EPHEDRINE 5 MG/ML INJ
INTRAVENOUS | Status: AC
  Filled 2022-03-14: qty 5

## 2022-03-14 MED ORDER — ACETAMINOPHEN 500 MG PO TABS
1000.0000 mg | ORAL_TABLET | Freq: Once | ORAL | Status: AC
  Administered 2022-03-14: 1000 mg via ORAL

## 2022-03-14 MED ORDER — FENTANYL CITRATE (PF) 100 MCG/2ML IJ SOLN
25.0000 ug | INTRAMUSCULAR | Status: DC | PRN
  Administered 2022-03-14 (×3): 50 ug via INTRAVENOUS

## 2022-03-14 MED ORDER — ACETAMINOPHEN 500 MG PO TABS
ORAL_TABLET | ORAL | Status: AC
  Filled 2022-03-14: qty 2

## 2022-03-14 MED ORDER — PROMETHAZINE HCL 25 MG/ML IJ SOLN: 6.2500 mg | INTRAMUSCULAR | Status: DC | PRN

## 2022-03-14 MED ORDER — PROPOFOL 10 MG/ML IV BOLUS
INTRAVENOUS | Status: DC | PRN
  Administered 2022-03-14: 180 mg via INTRAVENOUS

## 2022-03-14 SURGICAL SUPPLY — 108 items
ADH SKN CLS APL DERMABOND .7 (GAUZE/BANDAGES/DRESSINGS)
ANCH SUT 2 JK 1.5X2.9 2 LD (Anchor) ×4 IMPLANT
ANCH SUT KNTLS STRL SHLDR SYS (Anchor) ×2 IMPLANT
ANCHOR SUT JK SZ 2 2.9 DBL SL (Anchor) IMPLANT
ANCHOR SUT QUATTRO KNTLS 4.5 (Anchor) IMPLANT
APL PRP STRL LF DISP 70% ISPRP (MISCELLANEOUS) ×6
BANDAGE ESMARK 6X9 LF (GAUZE/BANDAGES/DRESSINGS) IMPLANT
BIT DRILL 2 CANN GRADUATED (BIT) IMPLANT
BIT DRILL 2.5 CANN LNG (BIT) IMPLANT
BIT DRILL 2.5 CANN STRL (BIT) IMPLANT
BIT DRILL JUGRKNT W/NDL BIT2.9 (DRILL) IMPLANT
BLADE SURG 15 STRL LF DISP TIS (BLADE) ×4 IMPLANT
BLADE SURG 15 STRL SS (BLADE) ×8
BNDG CMPR 5X4 CHSV STRCH STRL (GAUZE/BANDAGES/DRESSINGS)
BNDG CMPR 9X4 STRL LF SNTH (GAUZE/BANDAGES/DRESSINGS) ×2
BNDG CMPR 9X6 STRL LF SNTH (GAUZE/BANDAGES/DRESSINGS) ×2
BNDG COHESIVE 4X5 TAN STRL LF (GAUZE/BANDAGES/DRESSINGS) IMPLANT
BNDG ELASTIC 2X5.8 VLCR STR LF (GAUZE/BANDAGES/DRESSINGS) ×2 IMPLANT
BNDG ELASTIC 3X5.8 VLCR STR LF (GAUZE/BANDAGES/DRESSINGS) ×2 IMPLANT
BNDG ELASTIC 4X5.8 VLCR STR LF (GAUZE/BANDAGES/DRESSINGS) ×2 IMPLANT
BNDG ELASTIC 6X5.8 VLCR STR LF (GAUZE/BANDAGES/DRESSINGS) ×2 IMPLANT
BNDG ESMARK 4X9 LF (GAUZE/BANDAGES/DRESSINGS) ×2 IMPLANT
BNDG ESMARK 6X9 LF (GAUZE/BANDAGES/DRESSINGS) ×2
BNDG GAUZE DERMACEA FLUFF 4 (GAUZE/BANDAGES/DRESSINGS) ×2 IMPLANT
BNDG GZE DERMACEA 4 6PLY (GAUZE/BANDAGES/DRESSINGS)
CHLORAPREP W/TINT 26 (MISCELLANEOUS) ×2 IMPLANT
CORD BIPOLAR FORCEPS 12FT (ELECTRODE) ×2 IMPLANT
COVER BACK TABLE 60X90IN (DRAPES) ×2 IMPLANT
CUFF TOURN SGL QUICK 18X4 (TOURNIQUET CUFF) ×2 IMPLANT
CUFF TOURN SGL QUICK 24 (TOURNIQUET CUFF) ×2
CUFF TOURN SGL QUICK 34 (TOURNIQUET CUFF) ×2
CUFF TRNQT CYL 24X4X16.5-23 (TOURNIQUET CUFF) IMPLANT
CUFF TRNQT CYL 34X4.125X (TOURNIQUET CUFF) IMPLANT
DERMABOND ADVANCED .7 DNX12 (GAUZE/BANDAGES/DRESSINGS) ×2 IMPLANT
DRAPE C-ARM 42X72 X-RAY (DRAPES) ×2 IMPLANT
DRAPE C-ARMOR (DRAPES) ×2 IMPLANT
DRAPE EXTREMITY T 121X128X90 (DISPOSABLE) ×2 IMPLANT
DRAPE IMP U-DRAPE 54X76 (DRAPES) ×2 IMPLANT
DRAPE OEC MINIVIEW 54X84 (DRAPES) ×2 IMPLANT
DRAPE SURG 17X23 STRL (DRAPES) ×2 IMPLANT
DRAPE U-SHAPE 47X51 STRL (DRAPES) ×2 IMPLANT
DRILL JUGGERKNOT W/NDL BIT 2.9 (DRILL) ×2
DRSG ADAPTIC 3X8 NADH LF (GAUZE/BANDAGES/DRESSINGS) ×2 IMPLANT
ELECT REM PT RETURN 9FT ADLT (ELECTROSURGICAL) ×4
ELECTRODE REM PT RTRN 9FT ADLT (ELECTROSURGICAL) ×2 IMPLANT
GAUZE PAD ABD 8X10 STRL (GAUZE/BANDAGES/DRESSINGS) ×2 IMPLANT
GAUZE SPONGE 4X4 12PLY STRL (GAUZE/BANDAGES/DRESSINGS) ×2 IMPLANT
GLOVE BIO SURGEON STRL SZ8 (GLOVE) ×2 IMPLANT
GLOVE BIOGEL PI IND STRL 8 (GLOVE) ×2 IMPLANT
GLOVE ORTHO TXT STRL SZ7.5 (GLOVE) ×2 IMPLANT
GOWN STRL REUS W/ TWL LRG LVL3 (GOWN DISPOSABLE) ×2 IMPLANT
GOWN STRL REUS W/TWL LRG LVL3 (GOWN DISPOSABLE) ×4
GOWN STRL REUS W/TWL XL LVL3 (GOWN DISPOSABLE) ×2 IMPLANT
HD RADIAL EXPLOR 14X22 (Head) ×2 IMPLANT
HEAD RADIAL EXPLOR 14X22 (Head) IMPLANT
IMPL STEM WITH SCREW 8X29 (Stem) IMPLANT
IMPLANT STEM WITH SCREW 8X29 (Stem) ×2 IMPLANT
K-WIRE BB-TAK (WIRE) ×4
KWIRE BB-TAK (WIRE) IMPLANT
NDL HYPO 25X1 1.5 SAFETY (NEEDLE) IMPLANT
NEEDLE HYPO 22GX1.5 SAFETY (NEEDLE) IMPLANT
NEEDLE HYPO 25X1 1.5 SAFETY (NEEDLE) IMPLANT
NS IRRIG 1000ML POUR BTL (IV SOLUTION) ×2 IMPLANT
PACK BASIN DAY SURGERY FS (CUSTOM PROCEDURE TRAY) ×2 IMPLANT
PAD CAST 3X4 CTTN HI CHSV (CAST SUPPLIES) ×2 IMPLANT
PAD CAST 4YDX4 CTTN HI CHSV (CAST SUPPLIES) ×2 IMPLANT
PADDING CAST ABS COTTON 4X4 ST (CAST SUPPLIES) ×4 IMPLANT
PADDING CAST COTTON 3X4 STRL (CAST SUPPLIES)
PADDING CAST COTTON 4X4 STRL (CAST SUPPLIES)
PADDING CAST COTTON 6X4 STRL (CAST SUPPLIES) ×2 IMPLANT
PENCIL SMOKE EVACUATOR (MISCELLANEOUS) ×2 IMPLANT
PLATE FIBULA 6H (Plate) IMPLANT
SCREW BN T10 FT 20X2.7XST CORT (Screw) IMPLANT
SCREW COMP KREULOCK 2.7X16 (Screw) IMPLANT
SCREW COMP KREULOCK 2.7X18 (Screw) IMPLANT
SCREW COMP KREULOCK 3.5X12 (Screw) IMPLANT
SCREW COMP KREULOCK 3.5X14 (Screw) IMPLANT
SCREW CORT 2.7X20 (Screw) ×2 IMPLANT
SCREW CORT T15 FT 50X3.5XST (Screw) IMPLANT
SCREW CORTICAL 3.5 (Screw) ×2 IMPLANT
SCREW LO PRF TMSS 3.5X55 CORT (Screw) IMPLANT
SCREW LOW PROFILE 3.5X14 (Screw) IMPLANT
SLEEVE SCD COMPRESS KNEE MED (STOCKING) ×2 IMPLANT
SPIKE FLUID TRANSFER (MISCELLANEOUS) IMPLANT
SPLINT PLASTER CAST XFAST 4X15 (CAST SUPPLIES) IMPLANT
SPONGE T-LAP 18X18 ~~LOC~~+RFID (SPONGE) ×2 IMPLANT
STOCKINETTE 4X48 STRL (DRAPES) ×2 IMPLANT
SUCTION FRAZIER HANDLE 10FR (MISCELLANEOUS) ×2
SUCTION TUBE FRAZIER 10FR DISP (MISCELLANEOUS) ×2 IMPLANT
SUT ETHILON 2 0 FS 18 (SUTURE) IMPLANT
SUT ETHILON 3 0 PS 1 (SUTURE) ×2 IMPLANT
SUT MNCRL AB 3-0 PS2 27 (SUTURE) ×2 IMPLANT
SUT VIC AB 0 CT1 27 (SUTURE) ×4
SUT VIC AB 0 CT1 27XBRD ANBCTR (SUTURE) ×2 IMPLANT
SUT VIC AB 2-0 CT1 27 (SUTURE)
SUT VIC AB 2-0 CT1 TAPERPNT 27 (SUTURE) IMPLANT
SUT VIC AB 2-0 SH 18 (SUTURE) IMPLANT
SUT VIC AB 2-0 SH 27 (SUTURE) ×4
SUT VIC AB 2-0 SH 27XBRD (SUTURE) ×2 IMPLANT
SUT VIC AB 3-0 SH 27 (SUTURE) ×2
SUT VIC AB 3-0 SH 27X BRD (SUTURE) IMPLANT
SYR BULB EAR ULCER 3OZ GRN STR (SYRINGE) ×2 IMPLANT
SYR CONTROL 10ML LL (SYRINGE) IMPLANT
TOWEL GREEN STERILE FF (TOWEL DISPOSABLE) ×4 IMPLANT
TRAY DSU PREP LF (CUSTOM PROCEDURE TRAY) ×2 IMPLANT
TUBE CONNECTING 20X1/4 (TUBING) ×2 IMPLANT
UNDERPAD 30X36 HEAVY ABSORB (UNDERPADS AND DIAPERS) ×2 IMPLANT
YANKAUER SUCT BULB TIP NO VENT (SUCTIONS) ×2 IMPLANT

## 2022-03-14 NOTE — Op Note (Addendum)
03/14/2022  PATIENT:  Ariana Jarvis    PRE-OPERATIVE DIAGNOSIS:  LEFT ANKLE FRACTURE; LEFT RADIAL HEAD FREACTURE  POST-OPERATIVE DIAGNOSIS:  Same  PROCEDURE:  OPEN REDUCTION INTERNAL FIXATION (ORIF) ANKLE FRACTURE, RADIAL HEAD ARTHROPLASTY  SURGEON:  Cameka Rae A Jafar Poffenberger, MD  PHYSICIAN ASSISTANT: Alfonse Alpers, PA-C, present and scrubbed throughout the case, critical for completion in a timely fashion, and for retraction, instrumentation, and closure.  ANESTHESIA:   General  ESTIMATED BLOOD LOSS: 50cc  PREOPERATIVE INDICATIONS:  Ariana Jarvis is a  61 y.o. female with a diagnosis of LEFT ANKLE FRACTURE; LEFT RADIAL HEAD FREACTURE who elected for surgical management to minimize the risk for malunion and nonunion and post-traumatic arthritis.    The risks benefits and alternatives were discussed with the patient preoperatively including but not limited to the risks of infection, bleeding, nerve injury, cardiopulmonary complications, the need for revision surgery, the need for hardware removal, among others, and the patient was willing to proceed.  OPERATIVE IMPLANTS:  Arthrex 6-hole distal fibular locking plate Implant Name Type Inv. Item Serial No. Manufacturer Lot No. LRB No. Used Action  HD RADIAL EXPLOR 14X22 - [id number] Head HD RADIAL EXPLOR 14X22  [employer](ORTH,TRAU,BIO,SG) [id number] Left 1 Implanted  IMPLANT STEM WITH SCREW 8X29 - [id number] Stem IMPLANT STEM WITH SCREW 8X29  [employer](ORTH,TRAU,BIO,SG) [id number] Left 1 Implanted  ANCHOR SUT JK SZ 2 2.9 DBL SL - [id number] Anchor ANCHOR SUT JK SZ 2 2.9 DBL SL  [employer](ORTH,TRAU,BIO,SG) [id number] Left 1 Implanted  ANCHOR SUT JK SZ 2 2.9 DBL SL - [id number] Anchor ANCHOR SUT JK SZ 2 2.9 DBL SL  [employer](ORTH,TRAU,BIO,SG) [id number] Left 1 Implanted  ANCHOR SUT QUATTRO KNTLS 4.5 - [id number] Anchor ANCHOR SUT QUATTRO KNTLS 4.5  [employer](ORTH,TRAU,BIO,SG) [id number] Left 1 Implanted  PLATE FIBULA 6H - [id number]  Plate PLATE FIBULA 6H  [employer]  Left 1 Implanted  SCREW LOW PROFILE 3.5X14 - [id number] Screw SCREW LOW PROFILE 3.5X14  [employer]  Left 2 Implanted  SCREW COMP KREULOCK 3.5X14 - [id number] Screw SCREW COMP KREULOCK 3.5X14  [employer]  Left 1 Implanted  SCREW COMP KREULOCK 3.5X12 - [id number] Screw SCREW COMP KREULOCK 3.5X12  [employer]  Left 1 Implanted  SCREW CORT 2.7X20 - [id number] Screw SCREW CORT 2.7X20  [employer]  Left 1 Implanted  SCREW COMP KREULOCK 2.7X16 - [id number] Screw SCREW COMP KREULOCK 2.7X16  [employer]  Left 2 Implanted  SCREW COMP KREULOCK 2.7X18 - [id number] Screw SCREW COMP KREULOCK 2.7X18  [employer]  Left 1 Implanted  SCREW CORTICAL 3.5 - [id number] Screw SCREW CORTICAL 3.5  [employer]  Left 1 Implanted  SCREW LO PRF TMSS 3.5X55 CORT - [id number] Screw SCREW LO PRF TMSS 3.5X55 CORT  [employer]  Left 1 Implanted    OPERATIVE PROCEDURE: The patient was brought to the operating room and placed in the supine position. All bony prominences were padded. Non sterile Tourniquet was placed. General anesthesia was administered. The lower extremity was prepped and draped in the usual sterile fashion.  Time out was performed.  Tourniquet was inflated.  Incision was made over the distal fibula and the fracture was exposed.  There was significant comminution at the fracture site not amenable to lag screw fixation.  Instead restore the length and alignment of the fracture and provisionally secured with a pointed reduction clamp.. . Dissection for the plate proximally was performed with metz to bluntly spread and to protect crossing branches of the superficial peroneal nerve. I then applied  an appropriately sized distal fibular locking plate and secured it proximally with non locking screws and distally with locking screws. I used c-arm to confirm satisfactory reduction and fixation.   The syndesmosis was stressed using live fluoroscopy and found to be unstable.  Given degree of  instability and comminution at the fracture elected for quad cortical syndesmotic screws.  A small incision was made on the medial side of the ankle.  A Weber reduction clamp was placed posterior lateral to anterior superior and compressed to reduce the syndesmosis.  Two 3.5 cortical nonlocking screws were placed across the syndesmosis.  The wounds were irrigated, and closed with 2-0 vicryl.  3-0 nylon closure for the skin. Adaptic and Sterile gauze was applied followed by a posterior splint.  Please refer to Dr. Austin Miles op note regarding fixation of the left elbow injury.   She was awakened and returned to the PACU in stable and satisfactory condition. There were no complications.  Post op recs: WB: NWB LLE in splint Dressing: keep splint intact until follow up DVT prophylaxis: Xarelto given multi extremity injury Follow up: 2 weeks after surgery for a wound check and suture removal with Dr. Blanchie Dessert at [facility].  Address: [address], [place], [place] [address]  Office Phone: [phone]  Weber Cooks, MD Orthopaedic Surgery

## 2022-03-14 NOTE — Progress Notes (Signed)
Assisted Dr. Singer with left, popliteal, ultrasound guided block. Side rails up, monitors on throughout procedure. See vital signs in flow sheet. Tolerated Procedure well. 

## 2022-03-14 NOTE — Transfer of Care (Signed)
Immediate Anesthesia Transfer of Care Note  Patient: Ariana Jarvis  Procedure(s) Performed: OPEN REDUCTION INTERNAL FIXATION (ORIF) ANKLE FRACTURE (Left: Ankle) RADIAL HEAD ARTHROPLASTY (Left: Arm Lower)  Patient Location: PACU  Anesthesia Type:GA combined with regional for post-op pain  Level of Consciousness: drowsy  Airway & Oxygen Therapy: Patient Spontanous Breathing and Patient connected to face mask oxygen  Post-op Assessment: Report given to RN and Post -op Vital signs reviewed and stable  Post vital signs: Reviewed and stable  Last Vitals:   Vitals Value Taken Time  BP    Temp    Pulse 95 03/14/22 1545  Resp 16 03/14/22 1545  SpO2 100 % 03/14/22 1545  Vitals shown include unvalidated device data.  Last Pain:  Vitals:   03/14/22 1132  TempSrc: Oral  PainSc: 5          Complications: No notable events documented.

## 2022-03-14 NOTE — Anesthesia Procedure Notes (Addendum)
Procedure Name: LMA Insertion Date/Time: 03/14/2022 1:59 PM  Performed by: Lauralyn Primes, CRNAPre-anesthesia Checklist: Patient identified, Emergency Drugs available, Suction available and Patient being monitored Patient Re-evaluated:Patient Re-evaluated prior to induction Oxygen Delivery Method: Circle system utilized Preoxygenation: Pre-oxygenation with 100% oxygen Induction Type: IV induction Ventilation: Mask ventilation without difficulty LMA: LMA with gastric port inserted LMA Size: 4.0 Number of attempts: 1 Airway Equipment and Method: Bite block Placement Confirmation: positive ETCO2 Tube secured with: Tape Dental Injury: Teeth and Oropharynx as per pre-operative assessment

## 2022-03-14 NOTE — Anesthesia Procedure Notes (Signed)
Anesthesia Regional Block: Popliteal block   Pre-Anesthetic Checklist: , timeout performed,  Correct Patient, Correct Site, Correct Laterality,  Correct Procedure, Correct Position, site marked,  Risks and benefits discussed,  Surgical consent,  Pre-op evaluation,  At surgeon's request and post-op pain management  Laterality: Left  Prep: chloraprep       Needles:  Injection technique: Single-shot  Needle Type: Echogenic Stimulator Needle          Additional Needles:   Procedures:,,,, ultrasound used (permanent image in chart),,    Narrative:  Start time: 03/14/2022 12:16 PM End time: 03/14/2022 12:26 PM Injection made incrementally with aspirations every 5 mL.  Performed by: Personally  Anesthesiologist: Heather Roberts, MD  Additional Notes: A functioning IV was confirmed and monitors were applied.  Sterile prep and drape, hand hygiene and sterile gloves were used.  Negative aspiration and test dose prior to incremental administration of local anesthetic. The patient tolerated the procedure well.Ultrasound  guidance: relevant anatomy identified, needle position confirmed, local anesthetic spread visualized around nerve(s), vascular puncture avoided.  Image printed for medical record.

## 2022-03-14 NOTE — H&P (Signed)
ORTHOPAEDIC  H&P  REQUESTING PHYSICIAN: Joen Laura, MD  Chief Complaint: Fall at the gas station left ankle left elbow fracture  HPI: Ariana Jarvis is a 61 y.o. female who who had a fall at the gas station 11/24 resulting in a left ankle fracture and left elbow dislocation and fracture.  She was seen in the emergency room where closed reduction and splinting was performed through both extremities.  She denies distal numbness and tingling she denies pain other joints or extremities.  Past Medical History:  Diagnosis Date   Anxiety    Arthritis    back   Back pain    Chronic back pain    Depression    Gastric ulcer    GERD (gastroesophageal reflux disease)    Hypertension    Past Surgical History:  Procedure Laterality Date   CESAREAN SECTION     x2   CHOLECYSTECTOMY     COLONOSCOPY     Social History   Socioeconomic History   Marital status: Single    Spouse name: Not on file   Number of children: Not on file   Years of education: Not on file   Highest education level: Not on file  Occupational History   Not on file  Tobacco Use   Smoking status: Never   Smokeless tobacco: Never  Vaping Use   Vaping Use: Never used  Substance and Sexual Activity   Alcohol use: Not Currently   Drug use: Never   Sexual activity: Not Currently    Birth control/protection: Post-menopausal  Other Topics Concern   Not on file  Social History Narrative   Not on file   Social Determinants of Health   Financial Resource Strain: Not on file  Food Insecurity: Not on file  Transportation Needs: Not on file  Physical Activity: Not on file  Stress: Not on file  Social Connections: Not on file   Family History  Problem Relation Age of Onset   Hypertension Mother    Hypertension Father    Cancer Father        lung    Allergies  Allergen Reactions   Tramadol Itching     Positive ROS: All other systems have been reviewed and were otherwise negative with the  exception of those mentioned in the HPI and as above.  Physical Exam: General: Alert, no acute distress Cardiovascular: No pedal edema Respiratory: No cyanosis, no use of accessory musculature Skin: No lesions in the area of chief complaint Neurologic: Sensation intact distally Psychiatric: Patient is competent for consent with normal mood and affect  MUSCULOSKELETAL:  LUE No traumatic wounds, ecchymosis, or rash  Swelling and tenderness about the left elbow  Sens median, radial, ulnar intact  Motor AIN, PIN, IO intact  Radial pulse 2+, No significant edema LLE No traumatic wounds, ecchymosis, or rash  Swelling tenderness about the left ankle  No groin pain with log roll  Sens DPN, SPN, TN intact  Motor EHL, ext, flex 5/5  DP 2+, PT 2+, No significant edema    Assessment:   Left unstable bimalleolar ankle fracture, left elbow dislocation with displaced radial head fracture  Plan: Discussed with patient in clinic given the fractures of both her left ankle and left radial head would benefit from surgical fixation to reduce risk of posttraumatic arthritis, malreduction, and pain.  The risks benefits and alternatives were discussed with the patient including but not limited to the risks of nonoperative treatment, versus surgical intervention including infection, bleeding,  nerve injury, malunion, nonunion, the need for revision surgery, hardware prominence, hardware failure, the need for hardware removal, blood clots, cardiopulmonary complications, morbidity, mortality, among others, and they were willing to proceed.  Consent was signed by myself and the patient.  Left ankle and left elbow were marked.    Joen Laura, MD  Contact information:   FIEPPIRJ 7am-5pm epic message Dr. Blanchie Dessert, or call office for patient follow up: [phone] After hours and holidays please check [email or web address] for group call information for [facility]

## 2022-03-14 NOTE — Discharge Instructions (Addendum)
[facility] [address] [phone] (tel)   [phone] (fax)   POST-OPERATIVE INSTRUCTIONS - ARM   WOUND CARE Please keep splint clean dry and intact until followup.  You may shower on Post-Op Day #3.  You must keep splint dry during this process and may find that a plastic bag taped around the extremity or alternatively a towel based bath may be a better option.   If you get your splint wet or if it is damaged please contact our clinic.  EXERCISES Due to your splint being in place you will not be able to bear weight through your extremity.    You may use a sling for comfort   It is normal for your fingers/hand to become more swollen after surgery and discolored from bruising.   This will resolve over the first few weeks usually after surgery. Please continue to ambulate and do not stay sitting or lying for too long.  Perform foot and wrist pumps to assist in circulation.   POST-OPERATIVE INSTRUCTIONS - LOWER EXTREMITY   WOUND CARE Please keep splint clean dry and intact until followup.  You may shower on Post-Op Day #3.  You must keep splint dry during this process and may find that a plastic bag taped around the leg or alternatively a towel based bath may be a better option.   If you get your splint wet or if it is damaged please contact our clinic.  EXERCISES Due to your splint being in place you will not be able to bear weight through your extremity.   DO NOT PUT ANY WEIGHT ON YOUR OPERATIVE LEG Please use crutches or a walker to avoid weight bearing.   REGIONAL ANESTHESIA (NERVE BLOCKS) The anesthesia team may have performed a nerve block for you this is a great tool used to minimize pain.   The block may start wearing off overnight (between 8-24 hours postop) When the block wears off, your pain may go from nearly zero to the pain you would have had postop without the block. This is an abrupt transition but nothing dangerous is  happening.   This can be a challenging period but utilize your as needed pain medications to try and manage this period. We suggest you use the pain medication the first night prior to going to bed, to ease this transition.  You may take an extra dose of narcotic when this happens if needed   POST-OP MEDICATIONS- Multimodal approach to pain control In general your pain will be controlled with a combination of substances.  Prescriptions unless otherwise discussed are electronically sent to your pharmacy.  This is a carefully made plan we use to minimize narcotic use.     Acetaminophen - Non-narcotic pain medicine taken on a scheduled basis  Xarelto - This medicine is used to minimize the risk of blood clots after surgery. Zofran - take as needed for nausea  Continue your daily pain prescriptions as prescribed by your pain management provider  FOLLOW-UP If you develop a Fever (>101.5), Redness or Drainage from the surgical incision site, please call our office to arrange for an evaluation. Please call the office to schedule a follow-up appointment for your incision check if you do not already have one, 7-10 days post-operatively.  IF YOU HAVE ANY QUESTIONS, PLEASE FEEL FREE TO CALL OUR OFFICE.  HELPFUL INFORMATION   You may be more comfortable sleeping in a semi-seated position the first few nights following surgery.  Keep a  pillow propped under the elbow and forearm for comfort.  If you have a recliner type of chair it might be beneficial.  If not that is fine too, but it would be helpful to sleep propped up with pillows behind your operated shoulder as well under your elbow and forearm.  This will reduce pulling on the suture lines.   When dressing, put your operative arm in the sleeve first.  When getting undressed, take your operative arm out last.  Loose fitting, button-down shirts are recommended.  Often in the first days after surgery you may be more comfortable keeping your operative  arm under your shirt and not through the sleeve.   You may return to work/school in the next couple of days when you feel up to it.  Desk work and typing in the sling is fine.   We suggest you use the pain medication the first night prior to going to bed, in order to ease any pain when the anesthesia wears off. You should avoid taking pain medications on an empty stomach as it will make you nauseous.   You should wean off your narcotic medicines as soon as you are able.  Most patients will be off or using minimal narcotics before their first postop appointment.    Do not drink alcoholic beverages or take illicit drugs when taking pain medications.   It is against the law to drive while taking narcotics.  In some states it is against the law to drive while your arm is in a sling.    Pain medication may make you constipated.  Below are a few solutions to try in this order:   - Decrease the amount of pain medication if you aren't having pain.   - Drink lots of decaffeinated fluids.   - Drink prune juice and/or each dried prunes   If the first 3 don't work start with additional solutions   - Take Colace - an over-the-counter stool softener   - Take Senokot - an over-the-counter laxative   - Take Miralax - a stronger over-the-counter laxative     Post Anesthesia Home Care Instructions  Activity: Get plenty of rest for the remainder of the day. A responsible individual must stay with you for 24 hours following the procedure.  For the next 24 hours, DO NOT: -Drive a car -Advertising copywriter -Drink alcoholic beverages -Take any medication unless instructed by your physician -Make any legal decisions or sign important papers.  Meals: Start with liquid foods such as gelatin or soup. Progress to regular foods as tolerated. Avoid greasy, spicy, heavy foods. If nausea and/or vomiting occur, drink only clear liquids until the nausea and/or vomiting subsides. Call your physician if vomiting  continues.  Special Instructions/Symptoms: Your throat may feel dry or sore from the anesthesia or the breathing tube placed in your throat during surgery. If this causes discomfort, gargle with warm salt water. The discomfort should disappear within 24 hours.  Regional Anesthesia Blocks  1. Numbness or the inability to move the "blocked" extremity may last from 3-48 hours after placement. The length of time depends on the medication injected and your individual response to the medication. If the numbness is not going away after 48 hours, call your surgeon.  2. The extremity that is blocked will need to be protected until the numbness is gone and the  Strength has returned. Because you cannot feel it, you will need to take extra care to avoid injury. Because it may be weak,  you may have difficulty moving it or using it. You may not know what position it is in without looking at it while the block is in effect.  3. For blocks in the legs and feet, returning to weight bearing and walking needs to be done carefully. You will need to wait until the numbness is entirely gone and the strength has returned. You should be able to move your leg and foot normally before you try and bear weight or walk. You will need someone to be with you when you first try to ensure you do not fall and possibly risk injury.  4. Bruising and tenderness at the needle site are common side effects and will resolve in a few days.  5. Persistent numbness or new problems with movement should be communicated to the surgeon or the [facility] [phone] [facility] [phone]).

## 2022-03-14 NOTE — Op Note (Signed)
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  Unfortunately due to the patient's body habitus and BMI greater than 40 it was difficult to assess whether she actually had a terrible triad injury.  We additionally had a lower extremity injury upon examination the joint was clear that the lateral ligaments were completely avulsed from the lateral humerus.  There was significant instability of the joint.  The coronoid fracture fragment was not amenable to repair as it was quite small.  Removed any loose bodies and had good fixation of the radial head.  There are lateral ligament repair was able to be kept intact the joint was quite unstable and we felt that internal bracing the lateral ulnar collateral ligament would be appropriate to try and treat her dislocation.  She is extremely high risk of continued instability as well as stiffness.  If she did have significant stiffness we may consider opening the incision and cutting the 4 strands of suture to lay outside of her lateral fascia to try and allow further extension.  We could not do this for at least 6 to 8 weeks.  Post-operative plan: The patient will be nonweightbearing in a splint for 1 to 2 weeks transition likely to a thermoplastic splint to be removed just for range of motion.  She is allowed to use her arm in its brace for platform walker use.  The patient will be discharged home.  DVT prophylaxis Xarelto 10 mg/day.   Pain control with PRN pain medication preferring oral medicines.  Follow up plan will be scheduled in approximately 14 days for incision check and XR.  Post-Op  Diagnosis: Same Surgeons:Evita Merida Everardo Pacific MD Assistants:Caroline McBane PA-C Location: [facility] OR ROOM 5 Anesthesia: General with local anesthesia Antibiotics: Ancef 2 g with local vancomycin powder 1 g at the surgical site Tourniquet time:  Total Tourniquet Time Documented: Thigh (Left) - 55 minutes Total: Thigh (Left) - 55 minutes  Upper Arm (Left) - 54 minutes Total: Upper Arm (Left) - 54 minutes  Estimated Blood Loss: Minimal Complications: None Specimens: None Implants: Implant Name Type Inv. Item Serial No. Manufacturer Lot No. LRB No. Used Action  HD RADIAL EXPLOR 14X22 - [id number] Head HD RADIAL EXPLOR 14X22  [employer](ORTH,TRAU,BIO,SG) [id number] Left 1 Implanted  IMPLANT STEM WITH SCREW 8X29 - [id number] Stem IMPLANT STEM WITH SCREW 8X29  [employer](ORTH,TRAU,BIO,SG) [id number] Left 1 Implanted  ANCHOR SUT JK SZ 2 2.9 DBL SL - [id number] Anchor ANCHOR SUT JK SZ 2 2.9 DBL SL  [employer](ORTH,TRAU,BIO,SG) [id number] Left 1 Implanted  ANCHOR SUT JK SZ 2 2.9 DBL SL - [id number] Anchor ANCHOR SUT JK SZ 2 2.9 DBL SL  [employer](ORTH,TRAU,BIO,SG) [id number] Left 1 Implanted  ANCHOR SUT QUATTRO KNTLS 4.5 - [id number] Anchor ANCHOR SUT QUATTRO KNTLS 4.5  [employer](ORTH,TRAU,BIO,SG) [id number] Left 1 Implanted  PLATE FIBULA 6H - [id number] Plate PLATE FIBULA 6H  [employer]  Left 1 Implanted  SCREW LOW PROFILE 3.5X14 - [id number] Screw SCREW LOW PROFILE 3.5X14  [employer]  Left 2 Implanted  SCREW COMP KREULOCK 3.5X14 - [id number] Screw SCREW COMP KREULOCK 3.5X14  [employer]  Left 1 Implanted  SCREW COMP KREULOCK 3.5X12 - [id number] Screw SCREW COMP KREULOCK 3.5X12  [employer]  Left 1 Implanted  SCREW CORT 2.7X20 - [id number] Screw SCREW CORT 2.7X20  [employer]  Left 1 Implanted  SCREW COMP KREULOCK 2.7X16 - [id number] Screw SCREW COMP KREULOCK 2.7X16  [employer]  Left 2 Implanted  SCREW COMP KREULOCK 2.7X18 - [id number] Screw SCREW COMP KREULOCK 2.7X18  [employer]  Left 1 Implanted   SCREW CORTICAL 3.5 - [id number] Screw SCREW CORTICAL 3.5  [employer]  Left 1 Implanted  SCREW LO PRF TMSS 3.5X55 CORT - [id number] Screw SCREW LO PRF TMSS 3.5X55 CORT  [employer]  Left 1 Implanted    Indications for Surgery:   Ariana Jarvis is a 61 y.o. female with fall resulting in multiple injuries and I was asked to consult and provide care on the left elbow injury.  Patient had Additional injury to her lower extremity that Dr. Blanchie Dessert is managing.  Benefits and risks of operative and nonoperative management were discussed prior to surgery with patient/guardian(s) and informed consent form was completed.  Specific risks including infection, need for additional surgery, stiffness, loss of fixation, instability amongst others.   Procedure:   The patient was identified properly. Informed consent was obtained and the surgical site was marked. The patient was taken up to suite where general anesthesia was induced.  The patient was positioned supine on a regular bed.  The left elbow was prepped and draped in the usual sterile fashion.  Timeout was performed before the beginning of the case.  Tourniquet was used for the above duration.  We made a curvilinear incision over the lateral aspect of the elbow over Kocher's interval.  Went through skin sharply noting significant trauma and a fracture blister posteriorly.  We stayed away from this.  We obtained hemostasis we progressed.  We identified the lateral Kocher's interval and noted a traumatic disruption of it.  We used this disruption to enter the joint.  The lateral ulnar collateral ligament was completely avulsed from the lateral aspect of the humerus.  The elbow was clearly unstable and this was actually an dislocation upon examination of the joint.  We reduced the joint noted the coronoid fragment was quite small.  Radial head fragments were removed and we carefully open the annular ligament for eventual repair as a separate layer.  Placed  blunt Hohmann retractors around the radial head or able to form an osteotomy.  It was clear that it was not able to be repaired in the radial head arthroplasty would provide better stability for the patient.  Once the osteotomy was created we prepped the canal and used the [employer] Explor system to use a size 7 stem and 14 x 22 head.  We had good fit with this implant.  The patient's canal was quite patulous however we were not able to get an 8 broach in place.  At that point we checked our initial fluoroscopic images and demonstrated that we were able to keep the joint reduced.  The quality of the lateral tissues was quite poor.  We placed a 2.9 mm juggernaut anchor at the center of the medial epicondyle where the ligament would attach.  We then tapped for a Quatro link anchor just proximal to that.  We placed a 2.9 juggernaut anchor at the origin of the lateral ulnar collateral ligament on the ulnar.  The sutures were left for eventual internal bracing.  We irrigated the joint copiously clearing any other debris or soft tissue.  We held the joint reduced and then closed the  annular ligament with 0 Vicryl in a separate layer.  We then used our 2.9 mm juggernaut that was placed at the medial condyle to pass sutures in a running Krakw fashion through the lateral ulnar collateral ligament native tissue and pull it proximally imbricating it to the bone.  We did this with both limbs of tissue.  Once that was complete we were still quite unhappy with the quality of the lateral ligaments and felt that additional support would be needed.  We took the 4 limbs of suture from the additional juggernaut placed in the ulna and brought this up to a Quatro link anchor holding elbow in about 30 degrees short of full extension placing the anchor in the humerus ensuring that we were not overtensioning.  This provided good stability of the joint.  Final reduction was anatomic and we had good stability of the joint.  We  irrigated copiously placed local vancomycin local in the buttock and closed the incision finishing with nylon suture.  A well molded well-padded splint was placed after sterile dressing.  Patient awoken taken to PACU in stable condition.  Alfonse Alpers, PA-C, present and scrubbed throughout the case, critical for completion in a timely fashion, and for retraction, instrumentation, closure.   Please refer to Dr. Kyra Leyland note for his portion of the patient's care.  This was a complex case required 2 surgeons to operate in 2 separate extremities with 2 different operative teams.

## 2022-03-14 NOTE — Anesthesia Preprocedure Evaluation (Addendum)
Anesthesia Evaluation  Patient identified by MRN, date of birth, ID band Patient awake    Reviewed: Allergy & Precautions, NPO status , Patient's Chart, lab work & pertinent test results  History of Anesthesia Complications Negative for: history of anesthetic complications  Airway Mallampati: I  TM Distance: >3 FB Neck ROM: Full    Dental  (+) Edentulous Upper, Edentulous Lower, Dental Advisory Given   Pulmonary neg pulmonary ROS   Pulmonary exam normal        Cardiovascular hypertension, Pt. on medications Normal cardiovascular exam     Neuro/Psych   Anxiety Depression    negative neurological ROS     GI/Hepatic Neg liver ROS,GERD  Medicated,,  Endo/Other    Morbid obesity  Renal/GU negative Renal ROS     Musculoskeletal negative musculoskeletal ROS (+)    Abdominal   Peds  Hematology negative hematology ROS (+)   Anesthesia Other Findings   Reproductive/Obstetrics                             Anesthesia Physical Anesthesia Plan  ASA: 3  Anesthesia Plan: General   Post-op Pain Management: Regional block*   Induction:   PONV Risk Score and Plan: 4 or greater and Ondansetron, Dexamethasone, Midazolam and Scopolamine patch - Pre-op  Airway Management Planned: LMA  Additional Equipment:   Intra-op Plan:   Post-operative Plan: Extubation in OR  Informed Consent: I have reviewed the patients History and Physical, chart, labs and discussed the procedure including the risks, benefits and alternatives for the proposed anesthesia with the patient or authorized representative who has indicated his/her understanding and acceptance.     Dental advisory given  Plan Discussed with: Anesthesiologist, CRNA and Surgeon  Anesthesia Plan Comments:        Anesthesia Quick Evaluation

## 2022-03-15 ENCOUNTER — Encounter (HOSPITAL_BASED_OUTPATIENT_CLINIC_OR_DEPARTMENT_OTHER): Payer: Self-pay | Admitting: Orthopedic Surgery

## 2022-03-15 NOTE — Anesthesia Postprocedure Evaluation (Signed)
Anesthesia Post Note  Patient: Ariana Jarvis  Procedure(s) Performed: OPEN REDUCTION INTERNAL FIXATION (ORIF) ANKLE FRACTURE (Left: Ankle) RADIAL HEAD ARTHROPLASTY (Left: Arm Lower)     Patient location during evaluation: PACU Anesthesia Type: General Level of consciousness: awake and alert Pain management: pain level controlled Vital Signs Assessment: post-procedure vital signs reviewed and stable Respiratory status: spontaneous breathing, nonlabored ventilation and respiratory function stable Cardiovascular status: stable and blood pressure returned to baseline Anesthetic complications: no   No notable events documented.  Last Vitals:  Vitals:   03/14/22 1730 03/14/22 1731  BP: (!) 162/69 (!) 162/69  Pulse: 90 94  Resp:  16  Temp:  37.1 C  SpO2:  98%    Last Pain:  Vitals:   03/14/22 1731  TempSrc: Oral  PainSc: 5                  Beryle Lathe

## 2022-04-24 DIAGNOSIS — S52122D Displaced fracture of head of left radius, subsequent encounter for closed fracture with routine healing: Secondary | ICD-10-CM | POA: Diagnosis not present

## 2022-04-24 DIAGNOSIS — S82842D Displaced bimalleolar fracture of left lower leg, subsequent encounter for closed fracture with routine healing: Secondary | ICD-10-CM | POA: Diagnosis not present

## 2022-05-08 DIAGNOSIS — S52122D Displaced fracture of head of left radius, subsequent encounter for closed fracture with routine healing: Secondary | ICD-10-CM | POA: Diagnosis not present

## 2022-05-08 DIAGNOSIS — S82842D Displaced bimalleolar fracture of left lower leg, subsequent encounter for closed fracture with routine healing: Secondary | ICD-10-CM | POA: Diagnosis not present

## 2022-05-14 ENCOUNTER — Encounter (HOSPITAL_BASED_OUTPATIENT_CLINIC_OR_DEPARTMENT_OTHER)
Admission: RE | Admit: 2022-05-14 | Discharge: 2022-05-14 | Disposition: A | Discharge status: Home / Self Care | Payer: Managed Care, Other (non HMO) | Source: Ambulatory Visit | Attending: Orthopedic Surgery | Admitting: Orthopedic Surgery

## 2022-05-14 ENCOUNTER — Encounter (HOSPITAL_BASED_OUTPATIENT_CLINIC_OR_DEPARTMENT_OTHER): Payer: Self-pay | Admitting: Orthopedic Surgery

## 2022-05-14 ENCOUNTER — Other Ambulatory Visit: Payer: Self-pay

## 2022-05-14 DIAGNOSIS — Z01812 Encounter for preprocedural laboratory examination: Secondary | ICD-10-CM | POA: Insufficient documentation

## 2022-05-14 DIAGNOSIS — Z79899 Other long term (current) drug therapy: Secondary | ICD-10-CM | POA: Diagnosis not present

## 2022-05-14 DIAGNOSIS — I10 Essential (primary) hypertension: Secondary | ICD-10-CM | POA: Diagnosis not present

## 2022-05-14 DIAGNOSIS — Y793 Surgical instruments, materials and orthopedic devices (including sutures) associated with adverse incidents: Secondary | ICD-10-CM | POA: Diagnosis not present

## 2022-05-14 DIAGNOSIS — T84127A Displacement of internal fixation device of bone of left lower leg, initial encounter: Secondary | ICD-10-CM | POA: Diagnosis not present

## 2022-05-14 LAB — BASIC METABOLIC PANEL
Anion gap: 6 (ref 5–15)
BUN: 13 mg/dL (ref 8–23)
CO2: 28 mmol/L (ref 22–32)
Calcium: 10.9 mg/dL — ABNORMAL HIGH (ref 8.9–10.3)
Chloride: 103 mmol/L (ref 98–111)
Creatinine, Ser: 0.84 mg/dL (ref 0.44–1.00)
GFR, Estimated: >60 mL/min (ref >60)
Glucose, Bld: 152 mg/dL — ABNORMAL HIGH (ref 70–99)
Potassium: 4.6 mmol/L (ref 3.5–5.1)
Sodium: 137 mmol/L (ref 135–145)

## 2022-05-14 NOTE — Progress Notes (Signed)
   05/14/22 0847  PAT Phone Screen  Is the patient taking a GLP-1 receptor agonist? No  Do You Have Diabetes? No  Do You Have Hypertension? Yes  Have You Ever Been to the ER for Asthma? No  Have You Taken Oral Steroids in the Past 3 Months? No  Do you Take Phenteramine or any Other Diet Drugs? No  Recent  Lab Work, EKG, CXR? (S)  Yes (EKG 12-27-21 SB)  Do you have a history of heart problems? (S)  Yes (HTN managed by PCP)  Have you ever had tests on your heart? No  Any Recent Hospitalizations? No  Height 5\' 4"  (1.626 m)  Weight 103.9 kg  Pat Appointment Scheduled Yes

## 2022-05-16 ENCOUNTER — Ambulatory Visit (HOSPITAL_BASED_OUTPATIENT_CLINIC_OR_DEPARTMENT_OTHER): Payer: Managed Care, Other (non HMO) | Admitting: Certified Registered"

## 2022-05-16 ENCOUNTER — Ambulatory Visit (HOSPITAL_BASED_OUTPATIENT_CLINIC_OR_DEPARTMENT_OTHER): Payer: Managed Care, Other (non HMO)

## 2022-05-16 ENCOUNTER — Encounter (HOSPITAL_BASED_OUTPATIENT_CLINIC_OR_DEPARTMENT_OTHER): Payer: Self-pay | Admitting: Orthopedic Surgery

## 2022-05-16 ENCOUNTER — Encounter (HOSPITAL_BASED_OUTPATIENT_CLINIC_OR_DEPARTMENT_OTHER)
Admission: RE | Disposition: A | Discharge status: Home / Self Care | Payer: Self-pay | Source: Home / Self Care | Attending: Orthopedic Surgery

## 2022-05-16 ENCOUNTER — Ambulatory Visit (HOSPITAL_BASED_OUTPATIENT_CLINIC_OR_DEPARTMENT_OTHER)
Admission: RE | Admit: 2022-05-16 | Discharge: 2022-05-16 | Disposition: A | Discharge status: Home / Self Care | Payer: Managed Care, Other (non HMO) | Attending: Orthopedic Surgery | Admitting: Orthopedic Surgery

## 2022-05-16 ENCOUNTER — Other Ambulatory Visit: Payer: Self-pay

## 2022-05-16 DIAGNOSIS — T84127A Displacement of internal fixation device of bone of left lower leg, initial encounter: Secondary | ICD-10-CM | POA: Insufficient documentation

## 2022-05-16 DIAGNOSIS — Z01818 Encounter for other preprocedural examination: Secondary | ICD-10-CM

## 2022-05-16 DIAGNOSIS — Z6839 Body mass index (BMI) 39.0-39.9, adult: Secondary | ICD-10-CM

## 2022-05-16 DIAGNOSIS — Z79899 Other long term (current) drug therapy: Secondary | ICD-10-CM

## 2022-05-16 DIAGNOSIS — Y793 Surgical instruments, materials and orthopedic devices (including sutures) associated with adverse incidents: Secondary | ICD-10-CM | POA: Insufficient documentation

## 2022-05-16 DIAGNOSIS — T8484XA Pain due to internal orthopedic prosthetic devices, implants and grafts, initial encounter: Secondary | ICD-10-CM

## 2022-05-16 DIAGNOSIS — E669 Obesity, unspecified: Secondary | ICD-10-CM | POA: Diagnosis not present

## 2022-05-16 DIAGNOSIS — I10 Essential (primary) hypertension: Secondary | ICD-10-CM | POA: Insufficient documentation

## 2022-05-16 HISTORY — PX: HARDWARE REMOVAL: SHX979

## 2022-05-16 HISTORY — DX: Hyperlipidemia, unspecified: E78.5

## 2022-05-16 SURGERY — REMOVAL, HARDWARE
Anesthesia: General | Site: Ankle | Laterality: Left

## 2022-05-16 MED ORDER — ONDANSETRON HCL 4 MG/2ML IJ SOLN
INTRAMUSCULAR | Status: DC | PRN
  Administered 2022-05-16: 4 mg via INTRAVENOUS

## 2022-05-16 MED ORDER — MIDAZOLAM HCL 2 MG/2ML IJ SOLN: 0.5000 mg | Freq: Once | INTRAMUSCULAR | Status: DC | PRN

## 2022-05-16 MED ORDER — BUPIVACAINE HCL (PF) 0.5 % IJ SOLN
INTRAMUSCULAR | Status: DC | PRN
  Administered 2022-05-16: 5 mL

## 2022-05-16 MED ORDER — LACTATED RINGERS IV SOLN: INTRAVENOUS | Status: DC

## 2022-05-16 MED ORDER — ONDANSETRON HCL 4 MG/2ML IJ SOLN
INTRAMUSCULAR | Status: AC
  Filled 2022-05-16: qty 2

## 2022-05-16 MED ORDER — DEXAMETHASONE SODIUM PHOSPHATE 10 MG/ML IJ SOLN
INTRAMUSCULAR | Status: DC | PRN
  Administered 2022-05-16: 5 mg via INTRAVENOUS

## 2022-05-16 MED ORDER — LIDOCAINE 2% (20 MG/ML) 5 ML SYRINGE
INTRAMUSCULAR | Status: AC
  Filled 2022-05-16: qty 5

## 2022-05-16 MED ORDER — MEPERIDINE HCL 25 MG/ML IJ SOLN: 6.2500 mg | INTRAMUSCULAR | Status: DC | PRN

## 2022-05-16 MED ORDER — OXYCODONE HCL 5 MG PO TABS: 5.0000 mg | ORAL_TABLET | Freq: Once | ORAL | Status: DC | PRN

## 2022-05-16 MED ORDER — LACTATED RINGERS IV SOLN: INTRAVENOUS | Status: DC | PRN

## 2022-05-16 MED ORDER — PROPOFOL 10 MG/ML IV BOLUS
INTRAVENOUS | Status: DC | PRN
  Administered 2022-05-16: 150 mg via INTRAVENOUS

## 2022-05-16 MED ORDER — LIDOCAINE HCL (CARDIAC) PF 100 MG/5ML IV SOSY
PREFILLED_SYRINGE | INTRAVENOUS | Status: DC | PRN
  Administered 2022-05-16: 20 mg via INTRAVENOUS

## 2022-05-16 MED ORDER — ACETAMINOPHEN 500 MG PO TABS
ORAL_TABLET | ORAL | Status: AC
  Filled 2022-05-16: qty 2

## 2022-05-16 MED ORDER — ACETAMINOPHEN 500 MG PO TABS
1000.0000 mg | ORAL_TABLET | Freq: Once | ORAL | Status: AC
  Administered 2022-05-16: 1000 mg via ORAL

## 2022-05-16 MED ORDER — HYDROMORPHONE HCL 1 MG/ML IJ SOLN
0.2500 mg | INTRAMUSCULAR | Status: DC | PRN
  Administered 2022-05-16 (×2): 0.5 mg via INTRAVENOUS

## 2022-05-16 MED ORDER — VANCOMYCIN HCL IN DEXTROSE 1-5 GM/200ML-% IV SOLN
1000.0000 mg | INTRAVENOUS | Status: AC
  Administered 2022-05-16: 1000 mg via INTRAVENOUS

## 2022-05-16 MED ORDER — VANCOMYCIN HCL IN DEXTROSE 1-5 GM/200ML-% IV SOLN
INTRAVENOUS | Status: AC
  Filled 2022-05-16: qty 200

## 2022-05-16 MED ORDER — ASPIRIN 81 MG PO TBEC: 81.0000 mg | DELAYED_RELEASE_TABLET | Freq: Every day | ORAL | 0 refills | Status: AC

## 2022-05-16 MED ORDER — PROMETHAZINE HCL 25 MG/ML IJ SOLN: 6.2500 mg | INTRAMUSCULAR | Status: DC | PRN

## 2022-05-16 MED ORDER — HYDROMORPHONE HCL 1 MG/ML IJ SOLN
INTRAMUSCULAR | Status: AC
  Filled 2022-05-16: qty 0.5

## 2022-05-16 MED ORDER — EPHEDRINE SULFATE (PRESSORS) 50 MG/ML IJ SOLN
INTRAMUSCULAR | Status: DC | PRN
  Administered 2022-05-16: 10 mg via INTRAVENOUS

## 2022-05-16 MED ORDER — PROPOFOL 10 MG/ML IV BOLUS
INTRAVENOUS | Status: AC
  Filled 2022-05-16: qty 20

## 2022-05-16 MED ORDER — FENTANYL CITRATE (PF) 100 MCG/2ML IJ SOLN
INTRAMUSCULAR | Status: DC | PRN
  Administered 2022-05-16: 100 ug via INTRAVENOUS

## 2022-05-16 MED ORDER — 0.9 % SODIUM CHLORIDE (POUR BTL) OPTIME
TOPICAL | Status: DC | PRN
  Administered 2022-05-16: 200 mL

## 2022-05-16 MED ORDER — VANCOMYCIN HCL 500 MG IV SOLR
INTRAVENOUS | Status: DC | PRN
  Administered 2022-05-16: 500 mg via TOPICAL

## 2022-05-16 MED ORDER — SCOPOLAMINE 1 MG/3DAYS TD PT72
1.0000 | MEDICATED_PATCH | TRANSDERMAL | Status: DC
  Administered 2022-05-16: 1.5 mg via TRANSDERMAL

## 2022-05-16 MED ORDER — SCOPOLAMINE 1 MG/3DAYS TD PT72
MEDICATED_PATCH | TRANSDERMAL | Status: AC
  Filled 2022-05-16: qty 1

## 2022-05-16 MED ORDER — OXYCODONE HCL 5 MG/5ML PO SOLN: 5.0000 mg | Freq: Once | ORAL | Status: DC | PRN

## 2022-05-16 MED ORDER — FENTANYL CITRATE (PF) 100 MCG/2ML IJ SOLN
INTRAMUSCULAR | Status: AC
  Filled 2022-05-16: qty 2

## 2022-05-16 SURGICAL SUPPLY — 57 items
APL PRP STRL LF DISP 70% ISPRP (MISCELLANEOUS) ×1
BLADE SURG 15 STRL LF DISP TIS (BLADE) ×1 IMPLANT
BLADE SURG 15 STRL SS (BLADE) ×1
BNDG CMPR 5X4 CHSV STRCH STRL (GAUZE/BANDAGES/DRESSINGS) ×1
BNDG CMPR 9X4 STRL LF SNTH (GAUZE/BANDAGES/DRESSINGS)
BNDG COHESIVE 4X5 TAN STRL LF (GAUZE/BANDAGES/DRESSINGS) ×1 IMPLANT
BNDG ELASTIC 3X5.8 VLCR STR LF (GAUZE/BANDAGES/DRESSINGS) IMPLANT
BNDG ELASTIC 4X5.8 VLCR STR LF (GAUZE/BANDAGES/DRESSINGS) IMPLANT
BNDG ESMARK 4X9 LF (GAUZE/BANDAGES/DRESSINGS) IMPLANT
BNDG GAUZE DERMACEA FLUFF 4 (GAUZE/BANDAGES/DRESSINGS) IMPLANT
BNDG GZE DERMACEA 4 6PLY (GAUZE/BANDAGES/DRESSINGS)
BRUSH SCRUB EZ PLAIN DRY (MISCELLANEOUS) IMPLANT
CANISTER SUCT 1200ML W/VALVE (MISCELLANEOUS) IMPLANT
CHLORAPREP W/TINT 26 (MISCELLANEOUS) ×1 IMPLANT
CLSR STERI-STRIP ANTIMIC 1/2X4 (GAUZE/BANDAGES/DRESSINGS) IMPLANT
CORD BIPOLAR FORCEPS 12FT (ELECTRODE) IMPLANT
COVER BACK TABLE 60X90IN (DRAPES) ×1 IMPLANT
CUFF TOURN SGL QUICK 18X4 (TOURNIQUET CUFF) IMPLANT
DRAPE EXTREMITY T 121X128X90 (DISPOSABLE) ×1 IMPLANT
DRAPE IMP U-DRAPE 54X76 (DRAPES) ×1 IMPLANT
DRAPE INCISE IOBAN 66X45 STRL (DRAPES) IMPLANT
DRAPE OEC MINIVIEW 54X84 (DRAPES) ×1 IMPLANT
DRAPE SURG 17X23 STRL (DRAPES) ×1 IMPLANT
DRAPE U-SHAPE 47X51 STRL (DRAPES) ×1 IMPLANT
DRSG EMULSION OIL 3X3 NADH (GAUZE/BANDAGES/DRESSINGS) IMPLANT
ELECT REM PT RETURN 9FT ADLT (ELECTROSURGICAL)
ELECTRODE REM PT RTRN 9FT ADLT (ELECTROSURGICAL) IMPLANT
GAUZE SPONGE 4X4 12PLY STRL (GAUZE/BANDAGES/DRESSINGS) ×1 IMPLANT
GAUZE XEROFORM 1X8 LF (GAUZE/BANDAGES/DRESSINGS) ×1 IMPLANT
GLOVE BIO SURGEON STRL SZ8 (GLOVE) ×1 IMPLANT
GLOVE BIOGEL PI IND STRL 8 (GLOVE) ×1 IMPLANT
GLOVE SURG ORTHO 8.0 STRL STRW (GLOVE) ×1 IMPLANT
GOWN STRL REUS W/ TWL LRG LVL3 (GOWN DISPOSABLE) ×2 IMPLANT
GOWN STRL REUS W/TWL LRG LVL3 (GOWN DISPOSABLE) ×2
GOWN STRL REUS W/TWL XL LVL3 (GOWN DISPOSABLE) ×1 IMPLANT
NS IRRIG 1000ML POUR BTL (IV SOLUTION) IMPLANT
PACK BASIN DAY SURGERY FS (CUSTOM PROCEDURE TRAY) IMPLANT
PAD CAST 4YDX4 CTTN HI CHSV (CAST SUPPLIES) ×1 IMPLANT
PADDING CAST COTTON 4X4 STRL (CAST SUPPLIES) ×1
PENCIL SMOKE EVACUATOR (MISCELLANEOUS) ×1 IMPLANT
SHEET MEDIUM DRAPE 40X70 STRL (DRAPES) IMPLANT
SPIKE FLUID TRANSFER (MISCELLANEOUS) IMPLANT
SPONGE T-LAP 4X18 ~~LOC~~+RFID (SPONGE) IMPLANT
STOCKINETTE 4X48 STRL (DRAPES) IMPLANT
SUCTION FRAZIER HANDLE 10FR (MISCELLANEOUS)
SUCTION TUBE FRAZIER 10FR DISP (MISCELLANEOUS) IMPLANT
SUT ETHILON 3 0 PS 1 (SUTURE) IMPLANT
SUT MNCRL AB 4-0 PS2 18 (SUTURE) IMPLANT
SUT MON AB 2-0 CT1 36 (SUTURE) IMPLANT
SUT VIC AB 2-0 SH 27 (SUTURE)
SUT VIC AB 2-0 SH 27XBRD (SUTURE) IMPLANT
SUT VIC AB 3-0 SH 27 (SUTURE)
SUT VIC AB 3-0 SH 27X BRD (SUTURE) IMPLANT
SUT VICRYL 0 SH 27 (SUTURE) IMPLANT
SYR BULB EAR ULCER 3OZ GRN STR (SYRINGE) ×1 IMPLANT
TOWEL GREEN STERILE FF (TOWEL DISPOSABLE) IMPLANT
YANKAUER SUCT BULB TIP NO VENT (SUCTIONS) IMPLANT

## 2022-05-16 NOTE — Anesthesia Preprocedure Evaluation (Addendum)
Anesthesia Evaluation  Patient identified by MRN, date of birth, ID band Patient awake    Reviewed: Allergy & Precautions, NPO status , Patient's Chart, lab work & pertinent test results, reviewed documented beta blocker date and time   History of Anesthesia Complications Negative for: history of anesthetic complications  Airway Mallampati: I  TM Distance: >3 FB Neck ROM: Full    Dental  (+) Edentulous Upper, Edentulous Lower   Pulmonary neg pulmonary ROS   breath sounds clear to auscultation       Cardiovascular hypertension, Pt. on medications and Pt. on home beta blockers (-) angina  Rhythm:Regular Rate:Normal     Neuro/Psych Chronic back pain    GI/Hepatic Neg liver ROS,GERD  Medicated and Controlled,,  Endo/Other  BMI 39  Renal/GU negative Renal ROS     Musculoskeletal  (+) Arthritis ,    Abdominal  (+) + obese  Peds  Hematology negative hematology ROS (+)   Anesthesia Other Findings   Reproductive/Obstetrics                             Anesthesia Physical Anesthesia Plan  ASA: 3  Anesthesia Plan: General   Post-op Pain Management: Tylenol PO (pre-op)*   Induction: Intravenous  PONV Risk Score and Plan: 3 and Ondansetron and Dexamethasone  Airway Management Planned: LMA  Additional Equipment: None  Intra-op Plan:   Post-operative Plan:   Informed Consent: I have reviewed the patients History and Physical, chart, labs and discussed the procedure including the risks, benefits and alternatives for the proposed anesthesia with the patient or authorized representative who has indicated his/her understanding and acceptance.       Plan Discussed with: CRNA and Surgeon  Anesthesia Plan Comments:         Anesthesia Quick Evaluation

## 2022-05-16 NOTE — H&P (Signed)
ORTHOPAEDIC H&P  REQUESTING PHYSICIAN: Willaim Sheng, MD  Chief Complaint: left anke fracture  HPI: Ariana Jarvis is a 62 y.o. female who underwent ORIF of trimal ankle fx 03/14/22. She had done well post op, but as she has advanced weight bearing her syndesmotic screws have started to back out. She is having moderate pain with weight bearing on the left ankle.  Past Medical History:  Diagnosis Date   Anxiety    Arthritis    back   Back pain    Chronic back pain    Depression    Gastric ulcer    GERD (gastroesophageal reflux disease)    HLD (hyperlipidemia)    Hypertension    Past Surgical History:  Procedure Laterality Date   CESAREAN SECTION     x2   CHOLECYSTECTOMY     COLONOSCOPY     ORIF ANKLE FRACTURE Left 03/14/2022   Procedure: OPEN REDUCTION INTERNAL FIXATION (ORIF) ANKLE FRACTURE;  Surgeon: Willaim Sheng, MD;  Location: [facility];  Service: Orthopedics;  Laterality: Left;   ORIF RADIAL FRACTURE Left 03/14/2022   Procedure: RADIAL HEAD ARTHROPLASTY;  Surgeon: Willaim Sheng, MD;  Location: [facility];  Service: Orthopedics;  Laterality: Left;   Social History   Socioeconomic History   Marital status: Single    Spouse name: Not on file   Number of children: Not on file   Years of education: Not on file   Highest education level: Not on file  Occupational History   Not on file  Tobacco Use   Smoking status: Never   Smokeless tobacco: Never  Vaping Use   Vaping Use: Never used  Substance and Sexual Activity   Alcohol use: Not Currently   Drug use: Never   Sexual activity: Not Currently    Birth control/protection: Post-menopausal  Other Topics Concern   Not on file  Social History Narrative   Not on file   Social Determinants of Health   Financial Resource Strain: Not on file  Food Insecurity: Not on file  Transportation Needs: Not on file  Physical Activity: Not on file  Stress: Not on file   Social Connections: Not on file   Family History  Problem Relation Age of Onset   Hypertension Mother    Hypertension Father    Cancer Father        lung    Allergies  Allergen Reactions   Tramadol Itching     Positive ROS: All other systems have been reviewed and were otherwise negative with the exception of those mentioned in the HPI and as above.  Physical Exam: General: Alert, no acute distress Cardiovascular: No pedal edema Respiratory: No cyanosis, no use of accessory musculature Skin: No lesions in the area of chief complaint Neurologic: Sensation intact distally Psychiatric: Patient is competent for consent with normal mood and affect  MUSCULOSKELETAL:  LLE No traumatic wounds, ecchymosis, or rash  Nontender  No groin pain with log roll  No knee or ankle effusion  Knee stable to varus/ valgus stress  Sens DPN, SPN, TN intact  Motor EHL, ext, flex 5/5  DP 2+, PT 2+, No significant edema   IMAGING: xrays show evidence of mild arthritis, backing out of syndesmotic screws.   Assessment: Left syndesmotic screw failure   Plan: Discussed with patient that syndesmotic screw fixation typically results in the hardware breaking or backign out of if left in. Her case the screw is starting to back out. Should be  far enough out from surgery that her syndesmosis should be healed. Will stress ankle under fluoro and add syndesmotic suture anchor fixation if there is still instability.   The risks benefits and alternatives were discussed with the patient including but not limited to the risks of nonoperative treatment, versus surgical intervention including infection, bleeding, nerve injury, malunion, nonunion, the need for revision surgery, hardware prominence, hardware failure, the need for hardware removal, blood clots, cardiopulmonary complications, morbidity, mortality, among others, and they were willing to proceed.  Consent was signed by myself and the patient.  Left ankle  was marked.     Willaim Sheng, MD  Contact information:   RUE[facility] 7am-5pm epic message Dr. Zachery Dakins, or call office for patient follow up: [phone] After hours and holidays please check [email or web address] for group call information for [facility]

## 2022-05-16 NOTE — Anesthesia Postprocedure Evaluation (Signed)
Anesthesia Post Note  Patient: Ariana Jarvis  Procedure(s) Performed: HARDWARE REMOVAL LEFT ANKLE (Left: Ankle)     Patient location during evaluation: Phase II Anesthesia Type: General Level of consciousness: awake and alert, patient cooperative and oriented Pain management: pain level controlled Vital Signs Assessment: post-procedure vital signs reviewed and stable Respiratory status: spontaneous breathing, nonlabored ventilation and respiratory function stable Cardiovascular status: blood pressure returned to baseline and stable Postop Assessment: no apparent nausea or vomiting and adequate PO intake Anesthetic complications: no   No notable events documented.  Last Vitals:  Vitals:   05/16/22 1145 05/16/22 1200  BP: 119/72 133/73  Pulse: 74 80  Resp: 16 18  Temp:  36.7 C  SpO2: 94% 96%    Last Pain:  Vitals:   05/16/22 1200  TempSrc:   PainSc: 5                  Shontae Rosiles,E. Traeson Dusza

## 2022-05-16 NOTE — Transfer of Care (Signed)
Immediate Anesthesia Transfer of Care Note  Patient: Ariana Jarvis  Procedure(s) Performed: HARDWARE REMOVAL WITH POSSIBLE SYNDESMOSIS REPAIR (Left)  Patient Location: PACU  Anesthesia Type:General  Level of Consciousness: awake, drowsy, and patient cooperative  Airway & Oxygen Therapy: Patient Spontanous Breathing and Patient connected to face mask oxygen  Post-op Assessment: Report given to RN and Post -op Vital signs reviewed and stable  Post vital signs: Reviewed and stable  Last Vitals:  Vitals Value Taken Time  BP 158/71 05/16/22 1058  Temp    Pulse 72 05/16/22 1100  Resp 14 05/16/22 1100  SpO2 100 % 05/16/22 1100  Vitals shown include unvalidated device data.  Last Pain:  Vitals:   05/16/22 0838  TempSrc: Oral  PainSc: 5       Patients Stated Pain Goal: 5 (82/99/37 1696)  Complications: No notable events documented.

## 2022-05-16 NOTE — Op Note (Signed)
05/16/2022  11:16 AM  PATIENT:  Ariana Jarvis    PRE-OPERATIVE DIAGNOSIS: Left trimalleolar fracture with symptomatic screw failure  POST-OPERATIVE DIAGNOSIS:  Same  PROCEDURE: Partial hardware removal from left ankle  SURGEON:  Gevorg Brum A Shareese Macha, MD  PHYSICIAN ASSISTANT: none  ANESTHESIA:   General  PREOPERATIVE INDICATIONS:  Ariana Jarvis is a  62 y.o. female who underwent ORIF of her left ankle fracture with syndesmotic screw fixation 2 months ago.  Given severe medial instability and syndesmotic widening since screws were elected for fixation.  With syndesmotic screw fixation discussed possibility of the screws breaking to be removed once adequately healed.  At the last follow-up the distalmost of the spine x-ray was starting to back out.  Elected to remove the screw so that it would not cause skin issues if it was to back out further.  Discussed that we are removing the screws slightly early than would otherwise would be done elective so would consider possible syndesmotic tight rope fixation intra-op if any concerns for instability or incomplete syndesmotic healing.  The risks benefits and alternatives were discussed with the patient preoperatively including but not limited to the risks of infection, bleeding, nerve injury, cardiopulmonary complications, the need for revision surgery, among others, and the patient was willing to proceed.  ESTIMATED BLOOD LOSS: 5cc  OPERATIVE IMPLANTS: none  OPERATIVE FINDINGS: Successful partial large removal of the stable syndesmosis under fluoroscopy  OPERATIVE PROCEDURE:  Patient was brought to the operating room.  General anesthesia was induced.  Nonsterile tourniquet was placed on the left thigh.  The left lower extremity was prepped and draped in sterile fashion.  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  The left lower extremity was elevated and tourniquet was inflated.  Under fluoroscopy identified the area of  the incision will need to be incised to remove the syndesmotic screws.  Also we stressed the ankle joint and found the syndesmosis and medial clear space to be stable.  Skin was incised sharply and subcutaneous tissue was elevated with Metzenbaums scissors.  The syndesmotic screws were successfully identified and removed without difficulty.  Also elected to remove the proximal most distal screw as it was bicortical in the talofibular joint and may cause ankle joint irritation if left in and the fracture was otherwise healing very well.  Following removal the syndesmosis was again stressed with dorsiflexion external rotation and there was no evidence of widening of the syndesmosis or medial clear space.  The wound was thoroughly irrigated.  Vancomycin powder was placed.  5 cc of half percent Marcaine was injected into the incision.  Tourniquet was deflated. The incision was closed with interrupted 2-0 Vicryl and 3-0 nylon.  A soft sterile dressing was applied with Xeroform, 4 x 4 gauze, ABD, Webril, and Ace wrap.  The patient was awoken from anesthesia and brought to PACU in stable condition.  Post op recs: WB: WBAT Abx: vanc Imaging: none Dressing: keep intact until follow up, change PRN if soiled or saturated. DVT prophylaxis: aspirin 81mg  once daily Follow up: 2 weeks after surgery for a wound check with Dr. Zachery Dakins at [facility].  Address: [address] [place], [place], [place] [address]  Office Phone: [phone]

## 2022-05-16 NOTE — Discharge Instructions (Addendum)
Orthopedic Discharge Instructions  Diet: As you were doing prior to hospitalization   Shower:  May shower but keep the wounds dry, use an occlusive plastic wrap, NO SOAKING IN TUB.  If the bandage gets wet, change with a clean dry gauze.  If you have a splint on, leave the splint in place and keep the splint dry with a plastic bag.  Dressing:  You may change your dressing 3-5 days after surgery, unless you have a splint.  If the dressing remains clean and dry it can also be left on until follow up. If you change the dressing replace with clean gauze and tape or ace wrap. If you have a splint, then just leave the splint in place and we will change your bandages during your first follow-up appointment.  If water gets in the splint or the splint gets saturated please call the clinic and we can see you to change your splint.  Activity:  Increase activity slowly as tolerated, but follow the weight bearing instructions below.  The rules on driving is that you can not be taking narcotics while you drive, and you must feel in control of the vehicle.    Weight Bearing:   Weight bearing as tolerated.    Blood clot prevention (DVT Prophylaxis): After surgery you are at an increased risk for a blood clot. you should take baby aspirin 81mg , to be taken once daily for a total of 4 weeks from surgery to help reduce your risk of getting a blood clot. This will help prevent a blood clot. Signs of a pulmonary embolus (blood clot in the lungs) include sudden short of breath, feeling lightheaded or dizzy, chest pain with a deep breath, rapid pulse rapid breathing. Signs of a blood clot in your arms or legs include new unexplained swelling and cramping, warm, red or darkened skin around the painful area. Please call the office or 911 right away if these signs or symptoms develop. To prevent constipation: you may use a stool softener such as -  Colace (over the counter) 100 mg by mouth twice a day  Drink plenty of fluids  (prune juice may be helpful) and high fiber foods Miralax (over the counter) for constipation as needed.    Itching:  If you experience itching with your medications, try taking only a single pain pill, or even half a pain pill at a time.  You may take up to 10 pain pills per day, and you can also use benadryl over the counter for itching or also to help with sleep.   Precautions:  If you experience chest pain or shortness of breath - call 911 immediately for transfer to the hospital emergency department!!   Call office [phone]) for the following: Temperature greater than 101F Persistent nausea and vomiting Severe uncontrolled pain Redness, tenderness, or signs of infection (pain, swelling, redness, odor or green/yellow discharge around the site) Difficulty breathing, headache or visual disturbances Hives Persistent dizziness or light-headedness Extreme fatigue Any other questions or concerns you may have after discharge  In an emergency, call 911 or go to an Emergency Department at a nearby hospital  Follow- Up Appointment:  Please call for an appointment to be seen approximately 2-3 week after surgery in [facility] with your surgeon Dr. Charlies Constable - [phone] Address: [address], [place], [place] [address]    Post Anesthesia Home Care Instructions  Activity: Get plenty of rest for the remainder of the day. A responsible individual must stay  with you for 24 hours following the procedure.  For the next 24 hours, DO NOT: -Drive a car -Paediatric nurse -Drink alcoholic beverages -Take any medication unless instructed by your physician -Make any legal decisions or sign important papers.  Meals: Start with liquid foods such as gelatin or soup. Progress to regular foods as tolerated. Avoid greasy, spicy, heavy foods. If nausea and/or vomiting occur, drink only clear liquids until the nausea and/or vomiting subsides. Call your physician if vomiting  continues.  Special Instructions/Symptoms: Your throat may feel dry or sore from the anesthesia or the breathing tube placed in your throat during surgery. If this causes discomfort, gargle with warm salt water. The discomfort should disappear within 24 hours.  If you had a scopolamine patch placed behind your ear for the management of post- operative nausea and/or vomiting:  1. The medication in the patch is effective for 72 hours, after which it should be removed.  Wrap patch in a tissue and discard in the trash. Wash hands thoroughly with soap and water. 2. You may remove the patch earlier than 72 hours if you experience unpleasant side effects which may include dry mouth, dizziness or visual disturbances. 3. Avoid touching the patch. Wash your hands with soap and water after contact with the patch.    Tylenol can be taken after 2:45pm

## 2022-05-16 NOTE — Anesthesia Procedure Notes (Signed)
Procedure Name: LMA Insertion Date/Time: 05/16/2022 10:25 AM  Performed by: Verita Lamb, CRNAPre-anesthesia Checklist: Patient identified, Emergency Drugs available, Suction available and Patient being monitored Patient Re-evaluated:Patient Re-evaluated prior to induction Oxygen Delivery Method: Circle system utilized Preoxygenation: Pre-oxygenation with 100% oxygen Induction Type: IV induction Ventilation: Mask ventilation without difficulty LMA: LMA inserted LMA Size: 4.0 Number of attempts: 1 Airway Equipment and Method: Bite block Placement Confirmation: positive ETCO2, CO2 detector and breath sounds checked- equal and bilateral Tube secured with: Tape Dental Injury: Teeth and Oropharynx as per pre-operative assessment

## 2022-05-18 ENCOUNTER — Encounter (HOSPITAL_BASED_OUTPATIENT_CLINIC_OR_DEPARTMENT_OTHER): Payer: Self-pay | Admitting: Orthopedic Surgery

## 2022-05-19 NOTE — Progress Notes (Signed)
Left message stating courtesy call and if any questions or concerns please call the doctors office.  

## 2022-05-27 DIAGNOSIS — S82842D Displaced bimalleolar fracture of left lower leg, subsequent encounter for closed fracture with routine healing: Secondary | ICD-10-CM | POA: Diagnosis not present

## 2022-06-13 DIAGNOSIS — Z419 Encounter for procedure for purposes other than remedying health state, unspecified: Secondary | ICD-10-CM | POA: Diagnosis not present

## 2022-06-13 DIAGNOSIS — G8929 Other chronic pain: Secondary | ICD-10-CM | POA: Diagnosis not present

## 2022-06-13 DIAGNOSIS — R1013 Epigastric pain: Secondary | ICD-10-CM | POA: Diagnosis not present

## 2022-06-13 DIAGNOSIS — Z79899 Other long term (current) drug therapy: Secondary | ICD-10-CM | POA: Diagnosis not present

## 2022-06-13 DIAGNOSIS — Z9181 History of falling: Secondary | ICD-10-CM | POA: Diagnosis not present

## 2022-07-12 ENCOUNTER — Encounter (HOSPITAL_COMMUNITY): Payer: Self-pay

## 2022-07-12 ENCOUNTER — Ambulatory Visit (HOSPITAL_COMMUNITY)
Admission: EM | Admit: 2022-07-12 | Discharge: 2022-07-12 | Disposition: A | Discharge status: Home / Self Care | Payer: Managed Care, Other (non HMO) | Attending: Emergency Medicine | Admitting: Emergency Medicine

## 2022-07-12 DIAGNOSIS — H6691 Otitis media, unspecified, right ear: Secondary | ICD-10-CM | POA: Diagnosis not present

## 2022-07-12 MED ORDER — AMOXICILLIN-POT CLAVULANATE 875-125 MG PO TABS: 1.0000 | ORAL_TABLET | Freq: Two times a day (BID) | ORAL | 0 refills | Status: DC

## 2022-07-12 NOTE — ED Triage Notes (Signed)
Patient here today for right ear pain X 2 days. Her ears feel clogged. Some ST on the right side whenever she swallows. No fever. Has not tried any treatment. No sick contacts. No recent travel.

## 2022-07-12 NOTE — ED Provider Notes (Signed)
[facility]    CSN: [id number] Arrival date & time: 07/12/22  1618      History   Chief Complaint Chief Complaint  Patient presents with   Otalgia    HPI Ariana Jarvis is a 62 y.o. female.   Patient presents to clinic for 2 days of right ear pain.  She feels that that right ear is swollen, tender and painful.  She has not had any drainage or fevers.  She feels bilateral ear popping when she opens her drawl, has had some nasal congestion.   Denies chest pain, shortness of breath, blurred vision or headache. Denies sore throat or recent illness.     The history is provided by the patient.  Otalgia Associated symptoms: no abdominal pain, no ear discharge, no fever and no headaches     Past Medical History:  Diagnosis Date   Anxiety    Arthritis    back   Back pain    Chronic back pain    Depression    Gastric ulcer    GERD (gastroesophageal reflux disease)    HLD (hyperlipidemia)    Hypertension     There are no problems to display for this patient.   Past Surgical History:  Procedure Laterality Date   CESAREAN SECTION     x2   CHOLECYSTECTOMY     COLONOSCOPY     HARDWARE REMOVAL Left 05/16/2022   Procedure: HARDWARE REMOVAL LEFT ANKLE;  Surgeon: Willaim Sheng, MD;  Location: [place] [facility];  Service: Orthopedics;  Laterality: Left;   ORIF ANKLE FRACTURE Left 03/14/2022   Procedure: OPEN REDUCTION INTERNAL FIXATION (ORIF) ANKLE FRACTURE;  Surgeon: Willaim Sheng, MD;  Location: [facility];  Service: Orthopedics;  Laterality: Left;   ORIF RADIAL FRACTURE Left 03/14/2022   Procedure: RADIAL HEAD ARTHROPLASTY;  Surgeon: Willaim Sheng, MD;  Location: [facility];  Service: Orthopedics;  Laterality: Left;    OB History   No obstetric history on file.      Home Medications    Prior to Admission medications   Medication Sig Start Date End Date Taking? Authorizing Provider   amoxicillin-clavulanate (AUGMENTIN) 875-125 MG tablet Take 1 tablet by mouth every 12 (twelve) hours. 07/12/22  Yes Louretta Shorten, [place], FNP  atenolol (TENORMIN) 100 MG tablet Take 100 mg by mouth daily.   Yes [provider]  Cholecalciferol (VITAMIN D) 125 MCG (5000 UT) CAPS Take by mouth.   Yes [provider]  citalopram (CELEXA) 40 MG tablet Take 40 mg by mouth daily.   Yes [provider]  COENZYME Q10 PO Take by mouth.   Yes [provider]  hydrALAZINE (APRESOLINE) 50 MG tablet Take 50 mg by mouth 3 (three) times daily.   Yes [provider]  lisinopril-hydrochlorothiazide (ZESTORETIC) 20-12.5 MG tablet Take 1 tablet by mouth daily.   Yes [provider]  meloxicam (MOBIC) 15 MG tablet Take 15 mg by mouth daily.   Yes [provider]  omeprazole (PRILOSEC) 40 MG capsule Take 40 mg by mouth daily.   Yes [provider]  oxyCODONE (ROXICODONE) 15 MG immediate release tablet Take 15 mg by mouth every 8 (eight) hours as needed for pain.   Yes [provider]  oxymorphone (OPANA ER) 10 MG 12 hr tablet Take 10 mg by mouth in the morning, at noon, and at bedtime.   Yes [provider]  rosuvastatin (CRESTOR) 5 MG tablet Take 5 mg by  mouth daily.   Yes [provider]  solifenacin (VESICARE) 10 MG tablet Take 10 mg by mouth daily. 08/08/19  Yes [provider]    Family History Family History  Problem Relation Age of Onset   Hypertension Mother    Hypertension Father    Cancer Father        lung     Social History Social History   Tobacco Use   Smoking status: Never   Smokeless tobacco: Never  Vaping Use   Vaping Use: Never used  Substance Use Topics   Alcohol use: Not Currently   Drug use: Never     Allergies   Tramadol   Review of Systems Review of Systems  Constitutional:  Negative for chills, fatigue and fever.  HENT:  Positive for ear pain. Negative for ear  discharge and facial swelling.   Eyes:  Negative for photophobia, pain and visual disturbance.  Respiratory:  Negative for chest tightness.   Cardiovascular:  Negative for chest pain.  Gastrointestinal:  Negative for abdominal pain.  Genitourinary:  Negative for dysuria and flank pain.  Neurological:  Negative for syncope, weakness, light-headedness and headaches.     Physical Exam Triage Vital Signs ED Triage Vitals  Enc Vitals Group     BP 07/12/22 1736 (!) 173/100     Pulse Rate 07/12/22 1736 (!) 57     Resp 07/12/22 1736 16     Temp 07/12/22 1736 98.1 F (36.7 C)     Temp Source 07/12/22 1736 Oral     SpO2 07/12/22 1736 97 %     Weight 07/12/22 1736 230 lb (104.3 kg)     Height 07/12/22 1736 5\' 4"  (1.626 m)     Head Circumference --      Peak Flow --      Pain Score 07/12/22 1735 4     Pain Loc --      Pain Edu? --      Excl. in [place]? --    No data found.  Updated Vital Signs BP (!) 173/100 (BP Location: Right Arm)   Pulse (!) 57   Temp 98.1 F (36.7 C) (Oral)   Resp 16   Ht 5\' 4"  (1.626 m)   Wt 230 lb (104.3 kg)   SpO2 97%   BMI 39.48 kg/m   Visual Acuity Right Eye Distance:   Left Eye Distance:   Bilateral Distance:    Right Eye Near:   Left Eye Near:    Bilateral Near:     Physical Exam Vitals and nursing note reviewed.  Constitutional:      General: She is not in acute distress.    Appearance: She is well-developed.  HENT:     Head: Normocephalic and atraumatic.     Right Ear: Tympanic membrane, ear canal and external ear normal.     Left Ear: No drainage or swelling. Tympanic membrane is injected, erythematous and bulging.     Ears:     Comments: Left TM w/ injection, erythema and bulging. EAC w/o swelling or discharge.     Nose: Congestion and rhinorrhea present.     Mouth/Throat:     Mouth: Mucous membranes are moist.  Eyes:     Conjunctiva/sclera: Conjunctivae normal.  Cardiovascular:     Rate and Rhythm: Normal rate and regular rhythm.      Heart sounds: Normal heart sounds. No murmur heard. Pulmonary:     Effort: Pulmonary effort is normal. No respiratory distress.  Breath sounds: Normal breath sounds.  Abdominal:     Palpations: Abdomen is soft.     Tenderness: There is no abdominal tenderness.  Musculoskeletal:        General: No swelling.     Cervical back: Neck supple.  Skin:    General: Skin is warm and dry.     Capillary Refill: Capillary refill takes less than 2 seconds.  Neurological:     Mental Status: She is alert.  Psychiatric:        Mood and Affect: Mood normal.        Behavior: Behavior is cooperative.      UC Treatments / Results  Labs (all labs ordered are listed, but only abnormal results are displayed) Labs Reviewed - No data to display  EKG   Radiology No results found.  Procedures Procedures (including critical care time)  Medications Ordered in UC Medications - No data to display  Initial Impression / Assessment and Plan / UC Course  I have reviewed the triage vital signs and the nursing notes.  Pertinent labs & imaging results that were available during my care of the patient were reviewed by me and considered in my medical decision making (see chart for details).   Vitals in triage reviewed, patient is hemodynamically stable.  Is hypertensive with history of same encouraged to follow-up with PCP on Monday as scheduled.  Without any signs or symptoms of hypertensive urgency, blurred vision or headache.  Left tympanic membrane with injection, erythema and bulging, will cover with Augmentin for acute otitis media.  Discussed symptomatic management for nasal congestion and fluid buildup in ears with Mucinex.  Patient verbalized understanding, return and follow-up precautions discussed, no questions at this time.    Final Clinical Impressions(s) / UC Diagnoses   Final diagnoses:  Right otitis media, unspecified otitis media type     Discharge Instructions      Please  take all antibiotics and prescribed until finished.  You can take them with food to help prevent gastrointestinal upset.  Your blood pressure was high today, please ensure you are taking your medications, you can measure this within 10 minutes of waking and before going to bed to get adequate reading.  Please seek immediate care if you develop blurred vision, extreme headache, or blood pressure over 220/110.     ED Prescriptions     Medication Sig Dispense Auth. Provider   amoxicillin-clavulanate (AUGMENTIN) 875-125 MG tablet Take 1 tablet by mouth every 12 (twelve) hours. 14 tablet Fread K[place]ke, [place], [place]      PDMP not reviewed this encounter.   Fountain [place], [place], [place] 07/12/22 [phone]

## 2022-07-12 NOTE — Discharge Instructions (Addendum)
Please take all antibiotics and prescribed until finished.  You can take them with food to help prevent gastrointestinal upset.  Your blood pressure was high today, please ensure you are taking your medications, you can measure this within 10 minutes of waking and before going to bed to get adequate reading.  Please seek immediate care if you develop blurred vision, extreme headache, or blood pressure over 220/110.

## 2022-07-14 DIAGNOSIS — Z419 Encounter for procedure for purposes other than remedying health state, unspecified: Secondary | ICD-10-CM | POA: Diagnosis not present

## 2022-08-07 DIAGNOSIS — S82842D Displaced bimalleolar fracture of left lower leg, subsequent encounter for closed fracture with routine healing: Secondary | ICD-10-CM | POA: Diagnosis not present

## 2022-09-03 ENCOUNTER — Other Ambulatory Visit: Payer: Self-pay | Admitting: Orthopaedic Surgery

## 2022-09-04 ENCOUNTER — Encounter (HOSPITAL_COMMUNITY): Payer: Self-pay | Admitting: Orthopaedic Surgery

## 2022-09-04 ENCOUNTER — Other Ambulatory Visit: Payer: Self-pay

## 2022-09-04 NOTE — Progress Notes (Signed)
SDW call  Patient was given pre-op instructions over the phone. Patient verbalized understanding of instructions provided.     PCP - Dr. Fuller Plan, [place], [place] Cardiologist - Denies Pulmonary: Denies   PPM/ICD - Denies  Chest x-ray - 12/26/2021 EKG -  12/27/2021 Stress Test - ECHO -  Cardiac Cath -   Sleep Study/sleep apnea/CPAP: Denies  Non-diabetic  Blood Thinner Instructions: Denies Aspirin Instructions:Denies   ERAS Protcol - Yes, clear fluids until 0430 PRE-SURGERY Ensure or G2-    COVID TEST- n/a    Anesthesia review: No   Patient denies shortness of breath, fever, cough and chest pain over the phone call  Your procedure is scheduled on Tuesday, Sep 09, 2022  Report to [facility] Main Entrance "A" at  0530  A.M., then check in with the Admitting office.  Call this number if you have problems the morning of surgery:  [phone]   If you have any questions prior to your surgery date call [phone]: Open Monday-Friday 8am-4pm If you experience any cold or flu symptoms such as cough, fever, chills, shortness of breath, etc. between now and your scheduled surgery, please notify us at the above number    Remember:  Do not eat after midnight the night before your surgery  You may drink clear liquids until 0430    the morning of your surgery.   Clear liquids allowed are: Water, Non-Citrus Juices (without pulp), Carbonated Beverages, Clear Tea, Black Coffee ONLY (NO MILK, CREAM OR POWDERED CREAMER of any kind), and Gatorade   Take these medicines the morning of surgery with A SIP OF WATER:  Augmentin, celexa, hydralazine, omeprazole, crestor As of today, STOP taking any Aspirin (unless otherwise instructed by your surgeon) Aleve, Naproxen, Ibuprofen, Motrin, Advil, Goody's, BC's, all herbal medications, fish oil, and all vitamins. This includes your Mobic

## 2022-09-04 NOTE — Therapy (Signed)
OUTPATIENT PHYSICAL THERAPY LOWER EXTREMITY EVALUATION  PHYSICAL THERAPY DISCHARGE SUMMARY  Visits from Start of Care: 1  Current functional level related to goals / functional outcomes: See goals    Remaining deficits: N/A   Education / Equipment: See education below    Patient agrees to discharge. Patient goals were met. Patient is being discharged due to meeting the stated rehab goals.  Patient Name: Ariana Jarvis MRN: [id number] DOB:01-23-1961, 62 y.o., female Today's Date: 09/05/2022  END OF SESSION:  PT End of Session - 09/05/22 1056     Visit Number 1    Date for PT Re-Evaluation --   N/A D/C   Authorization Type [employer]/MCD    PT Start Time 1057    PT Stop Time 1127    PT Time Calculation (min) 30 min    Equipment Utilized During Treatment Other (comment)   RW   Activity Tolerance Patient tolerated treatment well    Behavior During Therapy WFL for tasks assessed/performed             Past Medical History:  Diagnosis Date   Anxiety    Arthritis    back   Back pain    Chronic back pain    Depression    Gastric ulcer    GERD (gastroesophageal reflux disease)    HLD (hyperlipidemia)    Hypertension    Past Surgical History:  Procedure Laterality Date   CESAREAN SECTION     x2   CHOLECYSTECTOMY     COLONOSCOPY     HARDWARE REMOVAL Left 05/16/2022   Procedure: HARDWARE REMOVAL LEFT ANKLE;  Surgeon: Joen Laura, MD;  Location: [facility];  Service: Orthopedics;  Laterality: Left;   ORIF ANKLE FRACTURE Left 03/14/2022   Procedure: OPEN REDUCTION INTERNAL FIXATION (ORIF) ANKLE FRACTURE;  Surgeon: Joen Laura, MD;  Location: [facility];  Service: Orthopedics;  Laterality: Left;   ORIF RADIAL FRACTURE Left 03/14/2022   Procedure: RADIAL HEAD ARTHROPLASTY;  Surgeon: Joen Laura, MD;  Location:  SUR[facility]ERY [facility];  Service: Orthopedics;  Laterality: Left;   There are no problems to display for  this patient.   PCP: Quitman Livings, MD   REFERRING PROVIDER: Terance Hart, MD   REFERRING DIAG: upcoming left foot surgery   THERAPY DIAG:  Other abnormalities of gait and mobility  Rationale for Evaluation and Treatment: Rehabilitation  ONSET DATE: November 2023  SUBJECTIVE:   SUBJECTIVE STATEMENT: Patient reports sustaining a Lt ankle fracture in November 2023 and underwent surgery in December 2023. She was NWB following this surgery, but only utilized a wheelchair for ambulation due to UE injury as well. She is scheduled to undergo another ankle surgery on 09/09/22 due to hardware failure and will NWB on the LLE for 6-8 weeks. She has not used a RW before and has plans to get a knee scooter.   PERTINENT HISTORY: ORIF Lt ankle 03/14/22 PAIN:  Are you having pain? Yes: NPRS scale: 3/10 Pain location: Lt ankle Pain description: throbbing,weak Aggravating factors: standing, walking Relieving factors: medication,rest  PRECAUTIONS: None  WEIGHT BEARING RESTRICTIONS: No; will be NWB LLE following surgery   FALLS:  Has patient fallen in last 6 months? Yes. Number of falls 1  LIVING ENVIRONMENT: Lives with: lives alone Lives in: House/apartment Stairs: No Has following equipment at home: shower chair and Grab bars  OCCUPATION: unemployed  PLOF: Independent  PATIENT GOALS: learn to not put weight on the foot   NEXT MD  VISIT: surgery on 09/09/22  OBJECTIVE:   DIAGNOSTIC FINDINGS:  Left ankle X-ray: IMPRESSION: Intraoperative fluoroscopic images of the left ankle demonstrate plate and screw fixation of distal fibular fractures and widening of the ankle mortise. No obvious perihardware fracture or component malpositioning.  PATIENT SURVEYS:  N/A  COGNITION: Overall cognitive status: Within functional limits for tasks assessed     SENSATION: Not tested  EDEMA:  Moderate swelling Lt ankle    POSTURE: rounded shoulders and forward  head  PALPATION: Not assessed   LOWER EXTREMITY MMT:  MMT Right eval Left eval  Hip flexion    Hip extension    Hip abduction    Hip adduction    Hip internal rotation    Hip external rotation    Knee flexion    Knee extension    Ankle dorsiflexion 5 4+  Ankle plantarflexion 5 4  Ankle inversion 5 4  Ankle eversion 5 4   (Blank rows = not tested)  LOWER EXTREMITY SPECIAL TESTS:  N/A  FUNCTIONAL TESTS:  RLE SLS: 4 seconds   GAIT: Distance walked: 10 ft  Assistive device utilized: None Level of assistance: Complete Independence Comments: antalgic Lt   [facility] Adult PT Treatment:                                                DATE: 09/05/22   Therapeutic Activity: Sit to stand maintaining NWB LLE Pivot transfer maintaining NWB LLE Demo of stair/curb negotiation maintaining NWB LLE Gait training: With RW maintaining NWB LLE       PATIENT EDUCATION:  Education details: see treatment; handouts provided on transfers and walking with NWB precaution  Person educated: Patient Education method: Explanation, Demonstration, Tactile cues, Verbal cues, and Handouts Education comprehension: verbalized understanding, returned demonstration, verbal cues required, tactile cues required, and needs further education  HOME EXERCISE PROGRAM: N/A  ASSESSMENT:  CLINICAL IMPRESSION: Patient is a 62 y.o. female who was seen today for physical therapy evaluation and treatment for upcoming Lt ankle surgery on 09/09/22 where she will be NWB  on the LLE for 6-8 weeks. We worked on Copy with RW while maintaining NWB LLE. She demonstrates good stability with short distance walking in clinic with no LOB and is able to complete transfers while maintaining weight-bearing precautions. It was recommended that she obtain a shower chair and BSC prior to upcoming surgery to improve her ability to complete these self-care activities independently.  She is appropriate for discharge  at this time as she is able to maintain NWB restrictions that will be necessary for her surgery on 09/09/22.   OBJECTIVE IMPAIRMENTS: Abnormal gait, decreased activity tolerance, decreased balance, difficulty walking, decreased strength, and pain.   ACTIVITY LIMITATIONS: stairs, transfers, and locomotion level  PARTICIPATION LIMITATIONS: shopping and community activity  PERSONAL FACTORS: Age, Fitness, and Time since onset of injury/illness/exacerbation are also affecting patient's functional outcome.   REHAB POTENTIAL: Good  CLINICAL DECISION MAKING: Stable/uncomplicated  EVALUATION COMPLEXITY: Low   GOALS: Goals reviewed with patient? Yes  SHORT TERM GOALS:= LTG Target date: 09/05/22  Patient will be able to maintain LLE NWB utilizing RW for household distances.  Baseline: Goal status: MET    PLAN:  PT FREQUENCY: N/A  PT DURATION: N/A  PLANNED INTERVENTIONS: Therapeutic exercises, Therapeutic activity, Neuromuscular re-education, Balance training, Gait training, Patient/Family education, and Self Care  PLAN FOR NEXT SESSION: N/A  Letitia Libra, PT, DPT, ATC 09/05/22 11:31 AM

## 2022-09-05 ENCOUNTER — Other Ambulatory Visit: Payer: Self-pay

## 2022-09-05 ENCOUNTER — Ambulatory Visit: Payer: Managed Care, Other (non HMO) | Attending: Orthopaedic Surgery

## 2022-09-05 DIAGNOSIS — R2689 Other abnormalities of gait and mobility: Secondary | ICD-10-CM | POA: Diagnosis not present

## 2022-09-08 NOTE — Anesthesia Preprocedure Evaluation (Signed)
Anesthesia Evaluation  Patient identified by MRN, date of birth, ID band Patient awake    Reviewed: Allergy & Precautions, H&P , NPO status , Patient's Chart, lab work & pertinent test results, reviewed documented beta blocker date and time   History of Anesthesia Complications Negative for: history of anesthetic complications  Airway Mallampati: I  TM Distance: >3 FB Neck ROM: Full    Dental no notable dental hx. (+) Edentulous Upper, Edentulous Lower   Pulmonary neg pulmonary ROS, former smoker   Pulmonary exam normal breath sounds clear to auscultation       Cardiovascular hypertension, Pt. on medications and Pt. on home beta blockers (-) angina negative cardio ROS Normal cardiovascular exam Rhythm:Regular Rate:Normal     Neuro/Psych  PSYCHIATRIC DISORDERS Anxiety Depression    Chronic back pain negative neurological ROS  negative psych ROS   GI/Hepatic negative GI ROS, Neg liver ROS, PUD,GERD  Medicated and Controlled,,  Endo/Other  negative endocrine ROS  BMI 39  Renal/GU negative Renal ROS  negative genitourinary   Musculoskeletal negative musculoskeletal ROS (+) Arthritis ,    Abdominal  (+) + obese  Peds negative pediatric ROS (+)  Hematology negative hematology ROS (+)   Anesthesia Other Findings   Reproductive/Obstetrics negative OB ROS                             Anesthesia Physical Anesthesia Plan  ASA: 3  Anesthesia Plan: General and Regional   Post-op Pain Management: Tylenol PO (pre-op)* and Regional block*   Induction: Intravenous  PONV Risk Score and Plan: 3 and Ondansetron and Dexamethasone  Airway Management Planned: LMA  Additional Equipment: None  Intra-op Plan:   Post-operative Plan:   Informed Consent: I have reviewed the patients History and Physical, chart, labs and discussed the procedure including the risks, benefits and alternatives for the  proposed anesthesia with the patient or authorized representative who has indicated his/her understanding and acceptance.       Plan Discussed with: CRNA and Surgeon  Anesthesia Plan Comments:         Anesthesia Quick Evaluation

## 2022-09-09 ENCOUNTER — Encounter (HOSPITAL_COMMUNITY): Payer: Self-pay | Admitting: Orthopaedic Surgery

## 2022-09-09 ENCOUNTER — Encounter (HOSPITAL_COMMUNITY)
Admission: RE | Disposition: A | Discharge status: Home / Self Care | Payer: Self-pay | Source: Home / Self Care | Attending: Orthopaedic Surgery

## 2022-09-09 ENCOUNTER — Ambulatory Visit (HOSPITAL_COMMUNITY)
Admission: RE | Admit: 2022-09-09 | Discharge: 2022-09-10 | Disposition: A | Discharge status: Home / Self Care | Payer: Managed Care, Other (non HMO) | Attending: Orthopaedic Surgery | Admitting: Orthopaedic Surgery

## 2022-09-09 ENCOUNTER — Ambulatory Visit (HOSPITAL_COMMUNITY): Payer: Managed Care, Other (non HMO) | Admitting: Anesthesiology

## 2022-09-09 ENCOUNTER — Ambulatory Visit (HOSPITAL_COMMUNITY): Payer: Managed Care, Other (non HMO)

## 2022-09-09 ENCOUNTER — Ambulatory Visit (HOSPITAL_BASED_OUTPATIENT_CLINIC_OR_DEPARTMENT_OTHER): Payer: Managed Care, Other (non HMO) | Admitting: Anesthesiology

## 2022-09-09 ENCOUNTER — Other Ambulatory Visit: Payer: Self-pay

## 2022-09-09 DIAGNOSIS — Z87891 Personal history of nicotine dependence: Secondary | ICD-10-CM | POA: Insufficient documentation

## 2022-09-09 DIAGNOSIS — G8929 Other chronic pain: Secondary | ICD-10-CM | POA: Diagnosis not present

## 2022-09-09 DIAGNOSIS — F419 Anxiety disorder, unspecified: Secondary | ICD-10-CM | POA: Diagnosis not present

## 2022-09-09 DIAGNOSIS — K219 Gastro-esophageal reflux disease without esophagitis: Secondary | ICD-10-CM | POA: Insufficient documentation

## 2022-09-09 DIAGNOSIS — S93432A Sprain of tibiofibular ligament of left ankle, initial encounter: Secondary | ICD-10-CM | POA: Diagnosis not present

## 2022-09-09 DIAGNOSIS — M19072 Primary osteoarthritis, left ankle and foot: Secondary | ICD-10-CM | POA: Diagnosis present

## 2022-09-09 DIAGNOSIS — I10 Essential (primary) hypertension: Secondary | ICD-10-CM

## 2022-09-09 DIAGNOSIS — X58XXXS Exposure to other specified factors, sequela: Secondary | ICD-10-CM | POA: Insufficient documentation

## 2022-09-09 DIAGNOSIS — F32A Depression, unspecified: Secondary | ICD-10-CM | POA: Insufficient documentation

## 2022-09-09 DIAGNOSIS — Z8711 Personal history of peptic ulcer disease: Secondary | ICD-10-CM | POA: Diagnosis not present

## 2022-09-09 DIAGNOSIS — M19172 Post-traumatic osteoarthritis, left ankle and foot: Secondary | ICD-10-CM | POA: Diagnosis not present

## 2022-09-09 DIAGNOSIS — S93432S Sprain of tibiofibular ligament of left ankle, sequela: Secondary | ICD-10-CM | POA: Insufficient documentation

## 2022-09-09 DIAGNOSIS — S82892S Other fracture of left lower leg, sequela: Secondary | ICD-10-CM | POA: Insufficient documentation

## 2022-09-09 HISTORY — PX: ANKLE ARTHROSCOPY WITH ARTHRODESIS: SHX5579

## 2022-09-09 HISTORY — PX: SYNDESMOSIS REPAIR: SHX5182

## 2022-09-09 LAB — URINALYSIS, ROUTINE W REFLEX MICROSCOPIC
Bacteria, UA: NONE SEEN
Bilirubin Urine: NEGATIVE
Glucose, UA: NEGATIVE mg/dL
Hgb urine dipstick: NEGATIVE
Ketones, ur: NEGATIVE mg/dL
Nitrite: NEGATIVE
Protein, ur: NEGATIVE mg/dL
Specific Gravity, Urine: 1.017 (ref 1.005–1.030)
pH: 5 (ref 5.0–8.0)

## 2022-09-09 LAB — CBC
HCT: 41.7 % (ref 36.0–46.0)
Hemoglobin: 13.8 g/dL (ref 12.0–15.0)
MCH: 27.9 pg (ref 26.0–34.0)
MCHC: 33.1 g/dL (ref 30.0–36.0)
MCV: 84.2 fL (ref 80.0–100.0)
Platelets: 192 10*3/uL (ref 150–400)
RBC: 4.95 MIL/uL (ref 3.87–5.11)
RDW: 13.2 % (ref 11.5–15.5)
WBC: 8.1 10*3/uL (ref 4.0–10.5)
nRBC: 0 % (ref 0.0–0.2)

## 2022-09-09 LAB — BASIC METABOLIC PANEL
Anion gap: 7 (ref 5–15)
BUN: 14 mg/dL (ref 8–23)
CO2: 27 mmol/L (ref 22–32)
Calcium: 9.9 mg/dL (ref 8.9–10.3)
Chloride: 102 mmol/L (ref 98–111)
Creatinine, Ser: 0.9 mg/dL (ref 0.44–1.00)
GFR, Estimated: >60 mL/min (ref >60)
Glucose, Bld: 134 mg/dL — ABNORMAL HIGH (ref 70–99)
Potassium: 3.7 mmol/L (ref 3.5–5.1)
Sodium: 136 mmol/L (ref 135–145)

## 2022-09-09 LAB — GLUCOSE, CAPILLARY: Glucose-Capillary: 141 mg/dL — ABNORMAL HIGH (ref 70–99)

## 2022-09-09 LAB — SURGICAL PCR SCREEN
MRSA, PCR: NEGATIVE
Staphylococcus aureus: POSITIVE — AB

## 2022-09-09 SURGERY — ARTHROSCOPY, ANKLE, WITH FUSION
Anesthesia: Regional | Site: Ankle | Laterality: Left

## 2022-09-09 MED ORDER — BUPIVACAINE LIPOSOME 1.3 % IJ SUSP
INTRAMUSCULAR | Status: DC | PRN
  Administered 2022-09-09: 10 mL via PERINEURAL

## 2022-09-09 MED ORDER — ONDANSETRON HCL 4 MG PO TABS
4.0000 mg | ORAL_TABLET | Freq: Four times a day (QID) | ORAL | Status: DC | PRN
  Administered 2022-09-09: 4 mg via ORAL
  Filled 2022-09-09: qty 1

## 2022-09-09 MED ORDER — METOCLOPRAMIDE HCL 5 MG/ML IJ SOLN: 5.0000 mg | Freq: Three times a day (TID) | INTRAMUSCULAR | Status: DC | PRN

## 2022-09-09 MED ORDER — PANTOPRAZOLE SODIUM 40 MG PO TBEC
80.0000 mg | DELAYED_RELEASE_TABLET | Freq: Every day | ORAL | Status: DC
  Administered 2022-09-10: 80 mg via ORAL
  Filled 2022-09-09: qty 2

## 2022-09-09 MED ORDER — OXYCODONE HCL 5 MG/5ML PO SOLN: 5.0000 mg | Freq: Once | ORAL | Status: DC | PRN

## 2022-09-09 MED ORDER — PROPOFOL 10 MG/ML IV BOLUS
INTRAVENOUS | Status: DC | PRN
  Administered 2022-09-09: 100 mg via INTRAVENOUS
  Administered 2022-09-09: 200 mg via INTRAVENOUS
  Administered 2022-09-09: 50 mg via INTRAVENOUS

## 2022-09-09 MED ORDER — ONDANSETRON HCL 4 MG/2ML IJ SOLN: 4.0000 mg | Freq: Once | INTRAMUSCULAR | Status: DC | PRN

## 2022-09-09 MED ORDER — MIDAZOLAM HCL 5 MG/5ML IJ SOLN
INTRAMUSCULAR | Status: DC | PRN
  Administered 2022-09-09: 2 mg via INTRAVENOUS

## 2022-09-09 MED ORDER — 0.9 % SODIUM CHLORIDE (POUR BTL) OPTIME
TOPICAL | Status: DC | PRN
  Administered 2022-09-09: 1000 mL

## 2022-09-09 MED ORDER — CEFAZOLIN SODIUM-DEXTROSE 2-4 GM/100ML-% IV SOLN
2.0000 g | Freq: Four times a day (QID) | INTRAVENOUS | Status: AC
  Administered 2022-09-09 – 2022-09-10 (×3): 2 g via INTRAVENOUS
  Filled 2022-09-09 (×3): qty 100

## 2022-09-09 MED ORDER — ACETAMINOPHEN 160 MG/5ML PO SOLN: 325.0000 mg | ORAL | Status: DC | PRN

## 2022-09-09 MED ORDER — MUPIROCIN 2 % EX OINT
1.0000 | TOPICAL_OINTMENT | Freq: Two times a day (BID) | CUTANEOUS | 0 refills | Status: AC
Start: 2022-09-09 — End: 2022-10-09

## 2022-09-09 MED ORDER — CEFAZOLIN SODIUM-DEXTROSE 2-4 GM/100ML-% IV SOLN
2.0000 g | INTRAVENOUS | Status: AC
  Administered 2022-09-09: 2 g via INTRAVENOUS
  Filled 2022-09-09: qty 100

## 2022-09-09 MED ORDER — LIDOCAINE 2% (20 MG/ML) 5 ML SYRINGE
INTRAMUSCULAR | Status: DC | PRN
  Administered 2022-09-09: 100 mg via INTRAVENOUS

## 2022-09-09 MED ORDER — EPHEDRINE SULFATE-NACL 50-0.9 MG/10ML-% IV SOSY
PREFILLED_SYRINGE | INTRAVENOUS | Status: DC | PRN
  Administered 2022-09-09: 5 mg via INTRAVENOUS
  Administered 2022-09-09: 10 mg via INTRAVENOUS
  Administered 2022-09-09: 5 mg via INTRAVENOUS

## 2022-09-09 MED ORDER — OXYCODONE HCL 5 MG PO TABS
10.0000 mg | ORAL_TABLET | ORAL | Status: DC | PRN
  Administered 2022-09-09 – 2022-09-10 (×2): 15 mg via ORAL
  Filled 2022-09-09 (×2): qty 3

## 2022-09-09 MED ORDER — LACTATED RINGERS IV SOLN: INTRAVENOUS | Status: DC | PRN

## 2022-09-09 MED ORDER — ONDANSETRON HCL 4 MG/2ML IJ SOLN
INTRAMUSCULAR | Status: DC | PRN
  Administered 2022-09-09: 4 mg via INTRAVENOUS

## 2022-09-09 MED ORDER — CHLORHEXIDINE GLUCONATE 0.12 % MT SOLN: 15.0000 mL | Freq: Once | OROMUCOSAL | Status: AC

## 2022-09-09 MED ORDER — BUPIVACAINE HCL (PF) 0.5 % IJ SOLN
INTRAMUSCULAR | Status: DC | PRN
  Administered 2022-09-09: 10 mL via PERINEURAL

## 2022-09-09 MED ORDER — PROPOFOL 10 MG/ML IV BOLUS
INTRAVENOUS | Status: AC
  Filled 2022-09-09: qty 20

## 2022-09-09 MED ORDER — ATENOLOL 100 MG PO TABS
100.0000 mg | ORAL_TABLET | Freq: Every day | ORAL | Status: DC
  Administered 2022-09-10: 100 mg via ORAL
  Filled 2022-09-09: qty 1
  Filled 2022-09-09: qty 2

## 2022-09-09 MED ORDER — MEPERIDINE HCL 25 MG/ML IJ SOLN: 6.2500 mg | INTRAMUSCULAR | Status: DC | PRN

## 2022-09-09 MED ORDER — FENTANYL CITRATE (PF) 250 MCG/5ML IJ SOLN
INTRAMUSCULAR | Status: AC
  Filled 2022-09-09: qty 5

## 2022-09-09 MED ORDER — HYDRALAZINE HCL 50 MG PO TABS
50.0000 mg | ORAL_TABLET | Freq: Two times a day (BID) | ORAL | Status: DC
  Administered 2022-09-09 – 2022-09-10 (×2): 50 mg via ORAL
  Filled 2022-09-09 (×2): qty 1

## 2022-09-09 MED ORDER — ROPIVACAINE HCL 5 MG/ML IJ SOLN
INTRAMUSCULAR | Status: DC | PRN
  Administered 2022-09-09: 20 mL via PERINEURAL

## 2022-09-09 MED ORDER — ACETAMINOPHEN 325 MG PO TABS: 325.0000 mg | ORAL_TABLET | Freq: Four times a day (QID) | ORAL | Status: DC | PRN

## 2022-09-09 MED ORDER — FESOTERODINE FUMARATE ER 4 MG PO TB24
4.0000 mg | ORAL_TABLET | Freq: Every day | ORAL | Status: DC
  Administered 2022-09-10: 4 mg via ORAL
  Filled 2022-09-09: qty 1

## 2022-09-09 MED ORDER — OXYCODONE HCL 5 MG PO TABS: 5.0000 mg | ORAL_TABLET | Freq: Once | ORAL | Status: DC | PRN

## 2022-09-09 MED ORDER — DOCUSATE SODIUM 100 MG PO CAPS
100.0000 mg | ORAL_CAPSULE | Freq: Two times a day (BID) | ORAL | Status: DC
  Administered 2022-09-09 – 2022-09-10 (×2): 100 mg via ORAL
  Filled 2022-09-09 (×2): qty 1

## 2022-09-09 MED ORDER — CHLORHEXIDINE GLUCONATE 0.12 % MT SOLN
OROMUCOSAL | Status: AC
  Administered 2022-09-09: 15 mL via OROMUCOSAL
  Filled 2022-09-09: qty 15

## 2022-09-09 MED ORDER — METOCLOPRAMIDE HCL 5 MG PO TABS: 5.0000 mg | ORAL_TABLET | Freq: Three times a day (TID) | ORAL | Status: DC | PRN

## 2022-09-09 MED ORDER — LACTATED RINGERS IV SOLN: INTRAVENOUS | Status: DC

## 2022-09-09 MED ORDER — ACETAMINOPHEN 325 MG PO TABS: 325.0000 mg | ORAL_TABLET | ORAL | Status: DC | PRN

## 2022-09-09 MED ORDER — MIDAZOLAM HCL 2 MG/2ML IJ SOLN
INTRAMUSCULAR | Status: AC
  Filled 2022-09-09: qty 2

## 2022-09-09 MED ORDER — DEXAMETHASONE SODIUM PHOSPHATE 10 MG/ML IJ SOLN
INTRAMUSCULAR | Status: DC | PRN
  Administered 2022-09-09: 5 mg via INTRAVENOUS

## 2022-09-09 MED ORDER — CHLORHEXIDINE GLUCONATE 4 % EX SOLN: 1.0000 | CUTANEOUS | 1 refills | Status: AC

## 2022-09-09 MED ORDER — FENTANYL CITRATE (PF) 100 MCG/2ML IJ SOLN
25.0000 ug | INTRAMUSCULAR | Status: DC | PRN
  Administered 2022-09-09 (×2): 50 ug via INTRAVENOUS

## 2022-09-09 MED ORDER — ASPIRIN 325 MG PO TABS
325.0000 mg | ORAL_TABLET | Freq: Every day | ORAL | Status: DC
  Administered 2022-09-10: 325 mg via ORAL
  Filled 2022-09-09: qty 1

## 2022-09-09 MED ORDER — OXYCODONE HCL 5 MG PO TABS
5.0000 mg | ORAL_TABLET | ORAL | Status: DC | PRN
  Administered 2022-09-09 – 2022-09-10 (×2): 10 mg via ORAL
  Filled 2022-09-09 (×2): qty 2

## 2022-09-09 MED ORDER — FENTANYL CITRATE (PF) 100 MCG/2ML IJ SOLN
INTRAMUSCULAR | Status: AC
  Filled 2022-09-09: qty 2

## 2022-09-09 MED ORDER — ROSUVASTATIN CALCIUM 5 MG PO TABS
5.0000 mg | ORAL_TABLET | Freq: Every day | ORAL | Status: DC
  Administered 2022-09-09 – 2022-09-10 (×2): 5 mg via ORAL
  Filled 2022-09-09 (×2): qty 1

## 2022-09-09 MED ORDER — METHOCARBAMOL 1000 MG/10ML IJ SOLN: 500.0000 mg | Freq: Four times a day (QID) | INTRAVENOUS | Status: DC | PRN

## 2022-09-09 MED ORDER — ONDANSETRON HCL 4 MG/2ML IJ SOLN
INTRAMUSCULAR | Status: AC
  Filled 2022-09-09: qty 2

## 2022-09-09 MED ORDER — HYDROMORPHONE HCL 1 MG/ML IJ SOLN: 0.5000 mg | INTRAMUSCULAR | Status: DC | PRN

## 2022-09-09 MED ORDER — ONDANSETRON HCL 4 MG/2ML IJ SOLN: 4.0000 mg | Freq: Four times a day (QID) | INTRAMUSCULAR | Status: DC | PRN

## 2022-09-09 MED ORDER — ACETAMINOPHEN 500 MG PO TABS
1000.0000 mg | ORAL_TABLET | Freq: Once | ORAL | Status: AC
  Administered 2022-09-09: 1000 mg via ORAL
  Filled 2022-09-09: qty 2

## 2022-09-09 MED ORDER — ORAL CARE MOUTH RINSE: 15.0000 mL | Freq: Once | OROMUCOSAL | Status: AC

## 2022-09-09 MED ORDER — FENTANYL CITRATE (PF) 250 MCG/5ML IJ SOLN
INTRAMUSCULAR | Status: DC | PRN
  Administered 2022-09-09 (×5): 50 ug via INTRAVENOUS

## 2022-09-09 MED ORDER — METHOCARBAMOL 500 MG PO TABS
500.0000 mg | ORAL_TABLET | Freq: Four times a day (QID) | ORAL | Status: DC | PRN
  Administered 2022-09-09 – 2022-09-10 (×4): 500 mg via ORAL
  Filled 2022-09-09 (×4): qty 1

## 2022-09-09 MED ORDER — CITALOPRAM HYDROBROMIDE 40 MG PO TABS
40.0000 mg | ORAL_TABLET | Freq: Every day | ORAL | Status: DC
  Administered 2022-09-10: 40 mg via ORAL
  Filled 2022-09-09: qty 1
  Filled 2022-09-09: qty 2

## 2022-09-09 MED ORDER — DEXAMETHASONE SODIUM PHOSPHATE 10 MG/ML IJ SOLN
INTRAMUSCULAR | Status: AC
  Filled 2022-09-09: qty 1

## 2022-09-09 MED ORDER — NAPROXEN 250 MG PO TABS
250.0000 mg | ORAL_TABLET | Freq: Two times a day (BID) | ORAL | Status: DC
  Administered 2022-09-09 – 2022-09-10 (×2): 250 mg via ORAL
  Filled 2022-09-09 (×2): qty 1

## 2022-09-09 MED ORDER — DIPHENHYDRAMINE HCL 12.5 MG/5ML PO ELIX: 12.5000 mg | ORAL_SOLUTION | ORAL | Status: DC | PRN

## 2022-09-09 MED ORDER — CELECOXIB 200 MG PO CAPS
200.0000 mg | ORAL_CAPSULE | Freq: Once | ORAL | Status: AC
  Administered 2022-09-09: 200 mg via ORAL
  Filled 2022-09-09: qty 1

## 2022-09-09 SURGICAL SUPPLY — 69 items
APL PRP STRL LF DISP 70% ISPRP (MISCELLANEOUS) ×1
BAG COUNTER SPONGE SURGICOUNT (BAG) ×1 IMPLANT
BAG SPNG CNTER NS LX DISP (BAG) ×1
BIT DRILL 2.5X2.75 QC CALB (BIT) IMPLANT
BIT DRILL LONG 3.1X160 (DRILL) IMPLANT
BIT DRILL SOLID LONG 2.8X160 (DRILL) IMPLANT
BNDG CMPR MED 10X6 ELC LF (GAUZE/BANDAGES/DRESSINGS) ×1
BNDG ELASTIC 6X10 VLCR STRL LF (GAUZE/BANDAGES/DRESSINGS) ×1 IMPLANT
CHLORAPREP W/TINT 26 (MISCELLANEOUS) ×1 IMPLANT
CUFF TOURN SGL QUICK 34 (TOURNIQUET CUFF) ×1
CUFF TRNQT CYL 34X4.125X (TOURNIQUET CUFF) ×1 IMPLANT
DRAPE C-ARM 42X72 X-RAY (DRAPES) IMPLANT
DRAPE C-ARMOR (DRAPES) ×1 IMPLANT
DRAPE OEC MINIVIEW 54X84 (DRAPES) ×1 IMPLANT
DRAPE U-SHAPE 47X51 STRL (DRAPES) ×1 IMPLANT
DRILL LONG 3.1X160 (DRILL) ×1
DRILL SOLID LONG 2.8X160 (DRILL) ×1
DRSG XEROFORM 1X8 (GAUZE/BANDAGES/DRESSINGS) ×1 IMPLANT
ELECT REM PT RETURN 9FT ADLT (ELECTROSURGICAL) ×1
ELECTRODE REM PT RTRN 9FT ADLT (ELECTROSURGICAL) ×1 IMPLANT
GAUZE SPONGE 4X4 12PLY STRL (GAUZE/BANDAGES/DRESSINGS) ×1 IMPLANT
GAUZE SPONGE 4X4 12PLY STRL LF (GAUZE/BANDAGES/DRESSINGS) ×1 IMPLANT
GAUZE XEROFORM 1X8 LF (GAUZE/BANDAGES/DRESSINGS) ×1 IMPLANT
GLOVE BIOGEL M STRL SZ7.5 (GLOVE) ×2 IMPLANT
GLOVE BIOGEL PI IND STRL 8 (GLOVE) ×2 IMPLANT
GOWN STRL REUS W/ TWL LRG LVL3 (GOWN DISPOSABLE) ×1 IMPLANT
GOWN STRL REUS W/ TWL XL LVL3 (GOWN DISPOSABLE) ×1 IMPLANT
GOWN STRL REUS W/TWL LRG LVL3 (GOWN DISPOSABLE) ×1
GOWN STRL REUS W/TWL XL LVL3 (GOWN DISPOSABLE) ×1
KIT BASIN OR (CUSTOM PROCEDURE TRAY) ×1 IMPLANT
NDL 18GX1X1/2 (RX/OR ONLY) (NEEDLE) ×1 IMPLANT
NEEDLE 18GX1X1/2 (RX/OR ONLY) (NEEDLE) IMPLANT
NS IRRIG 1000ML POUR BTL (IV SOLUTION) ×1 IMPLANT
PACK ORTHO EXTREMITY (CUSTOM PROCEDURE TRAY) ×1 IMPLANT
PAD CAST 4YDX4 CTTN HI CHSV (CAST SUPPLIES) ×1 IMPLANT
PADDING CAST ABS COTTON 6X4 NS (CAST SUPPLIES) IMPLANT
PADDING CAST COTTON 4X4 STRL (CAST SUPPLIES) ×1
PADDING CAST COTTON 6X4 STRL (CAST SUPPLIES) IMPLANT
PADDING CAST SYNTHETIC 4X4 STR (CAST SUPPLIES) IMPLANT
PIN GUIDE DRIL TIP 2.8X300 STE (PIN) IMPLANT
PLATE ANT TT PRIM LT (Plate) IMPLANT
PUTTY DBM STAGRAFT PLUS 5CC (Putty) IMPLANT
SCREW CORTICAL 3.5MM 48MM (Screw) IMPLANT
SCREW CORTICAL 3.5MM 50MM (Screw) IMPLANT
SCREW LOCK PLATE R3 4.2X20 (Screw) IMPLANT
SCREW LOCK PLATE R3 4.2X28 (Screw) ×1 IMPLANT
SCREW LOCK PLATE R3 4.2X34 (Screw) IMPLANT
SCREW LOCK PLATE SB 4.5X38 (Screw) IMPLANT
SCREW LOCK PLATE SB 4.5X42 (Screw) IMPLANT
SCREW LOCK PLT 28X4.2X GRLL (Screw) IMPLANT
SCREW NONLOCK 4.5X28 (Screw) IMPLANT
SCREW NONLOCK PLATE R3 4.2X24 (Screw) IMPLANT
SPIKE FLUID TRANSFER (MISCELLANEOUS) ×1 IMPLANT
SPLINT PLASTER CAST FAST 5X30 (CAST SUPPLIES) IMPLANT
SPONGE T-LAP 18X18 ~~LOC~~+RFID (SPONGE) ×1 IMPLANT
STRIP CLOSURE SKIN 1/2X4 (GAUZE/BANDAGES/DRESSINGS) ×1 IMPLANT
SUCTION FRAZIER HANDLE 12FR (TUBING) ×1
SUCTION TUBE FRAZIER 12FR DISP (TUBING) IMPLANT
SUT ETHILON 3 0 PS 1 (SUTURE) IMPLANT
SUT MNCRL AB 3-0 PS2 18 (SUTURE) ×1 IMPLANT
SUT MNCRL AB 3-0 PS2 27 (SUTURE) IMPLANT
SUT VIC AB 2-0 CT1 27 (SUTURE) ×1
SUT VIC AB 2-0 CT1 TAPERPNT 27 (SUTURE) IMPLANT
SUT VIC AB 3-0 FS2 27 (SUTURE) ×1 IMPLANT
SYR 20ML LL LF (SYRINGE) ×1 IMPLANT
TUBE CONNECTING 12X1/4 (SUCTIONS) IMPLANT
TUBE CONNECTING 20X1/4 (TUBING) ×1 IMPLANT
WIRE OLIVE HALF 1.6X80 (WIRE) IMPLANT
YANKAUER SUCT BULB TIP NO VENT (SUCTIONS) IMPLANT

## 2022-09-09 NOTE — Anesthesia Procedure Notes (Signed)
Anesthesia Regional Block: Popliteal block   Pre-Anesthetic Checklist: , timeout performed,  Correct Patient, Correct Site, Correct Laterality,  Correct Procedure, Correct Position, site marked,  Risks and benefits discussed,  Surgical consent,  Pre-op evaluation,  At surgeon's request and post-op pain management  Laterality: Left  Prep: chloraprep       Needles:  Injection technique: Single-shot  Needle Type: Echogenic Stimulator Needle     Needle Length: 5cm  Needle Gauge: 22     Additional Needles:   Procedures:, nerve stimulator,,, ultrasound used (permanent image in chart),,     Nerve Stimulator or Paresthesia:  Response: foot, 0.45 mA  Additional Responses:   Narrative:  Start time: 09/09/2022 7:20 AM End time: 09/09/2022 7:25 AM Injection made incrementally with aspirations every 5 mL.  Performed by: Personally  Anesthesiologist: Bethena Midget, MD  Additional Notes: Functioning IV was confirmed and monitors were applied.  A 50mm 22ga Arrow echogenic stimulator needle was used. Sterile prep and drape,hand hygiene and sterile gloves were used. Ultrasound guidance: relevant anatomy identified, needle position confirmed, local anesthetic spread visualized around nerve(s)., vascular puncture avoided.  Image printed for medical record. Negative aspiration and negative test dose prior to incremental administration of local anesthetic. The patient tolerated the procedure well.

## 2022-09-09 NOTE — H&P (Signed)
PREOPERATIVE H&P  Chief Complaint: Left ankle pain  HPI: Ariana Jarvis is a 62 y.o. 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  Patient had an ankle fracture several months ago and subsequently went on to collapse of her lateral tibial plafond with widening of her syndesmosis and instability.  She was seen in the office and indicated for ankle arthrodesis with open treatment of her syndesmosis and distal tibiofibular joint arthrodesis.  She is here today for surgery. Symptoms are rated as moderate to severe, and have been worsening.  This is significantly impairing activities of daily living.  She has elected for surgical management.   Past Medical History:  Diagnosis Date   Anxiety    Arthritis    back   Back pain    Chronic back pain    Depression    Gastric ulcer    GERD (gastroesophageal reflux disease)    HLD (hyperlipidemia)    Hypertension    Past Surgical History:  Procedure Laterality Date   CESAREAN SECTION     x2   CHOLECYSTECTOMY     COLONOSCOPY     HARDWARE REMOVAL Left 05/16/2022   Procedure: HARDWARE REMOVAL LEFT ANKLE;  Surgeon: Joen Laura, MD;  Location: [facility];  Service: Orthopedics;  Laterality: Left;   ORIF ANKLE FRACTURE Left 03/14/2022   Procedure: OPEN REDUCTION INTERNAL FIXATION (ORIF) ANKLE FRACTURE;  Surgeon: Joen Laura, MD;  Location: [facility];  Service: Orthopedics;  Laterality: Left;   ORIF RADIAL FRACTURE Left 03/14/2022   Procedure: RADIAL HEAD ARTHROPLASTY;  Surgeon: Joen Laura, MD;  Location: [facility];  Service: Orthopedics;  Laterality: Left;   Social History   Socioeconomic History   Marital status: Single    Spouse name: Not on file   Number of children: Not on file   Years of education: Not on file   Highest education level: Not  on file  Occupational History   Not on file  Tobacco Use   Smoking status: Former    Types: Cigarettes   Smokeless tobacco: Never  Vaping Use   Vaping Use: Never used  Substance and Sexual Activity   Alcohol use: Not Currently   Drug use: Never   Sexual activity: Not Currently    Birth control/protection: Post-menopausal  Other Topics Concern   Not on file  Social History Narrative   Not on file   Social Determinants of Health   Financial Resource Strain: Not on file  Food Insecurity: Not on file  Transportation Needs: Not on file  Physical Activity: Not on file  Stress: Not on file  Social Connections: Not on file   Family History  Problem Relation Age of Onset   Hypertension Mother    Hypertension Father    Cancer Father        lung    Allergies  Allergen Reactions   Tramadol Itching   Prior to Admission medications   Medication Sig Start Date End Date Taking? Authorizing Provider  amoxicillin-clavulanate (AUGMENTIN) 875-125 MG tablet Take 1 tablet by mouth every 12 (twelve) hours. 07/12/22  Yes Rinaldo Ratel, [place] N, FNP  atenolol (TENORMIN) 100 MG tablet Take 100 mg by mouth daily.   Yes [provider]  citalopram (CELEXA) 40 MG tablet Take 40 mg by mouth daily.   Yes [provider]  hydrALAZINE (APRESOLINE) 50 MG tablet Take 50  mg by mouth in the morning and at bedtime.   Yes [provider]  lisinopril-hydrochlorothiazide (ZESTORETIC) 20-12.5 MG tablet Take 1 tablet by mouth daily.   Yes [provider]  meloxicam (MOBIC) 15 MG tablet Take 15 mg by mouth daily.   Yes [provider]  Multiple Vitamins-Minerals (HAIR SKIN & NAILS) TABS Take 2 tablets by mouth daily.   Yes [provider]  omeprazole (PRILOSEC) 40 MG capsule Take 40 mg by mouth daily.   Yes [provider]  oxyCODONE (ROXICODONE) 15 MG immediate release tablet Take 15 mg by mouth every 4 (four) hours as needed for pain.   Yes [provider]  rosuvastatin (CRESTOR) 5 MG tablet Take 5 mg by mouth daily.   Yes [provider]  solifenacin (VESICARE) 10 MG tablet Take 10 mg by mouth daily. 08/08/19  Yes [provider]  Vitamin D, Ergocalciferol, (DRISDOL) 1.25 MG (50000 UNIT) CAPS capsule Take 50,000 Units by mouth once a week.   Yes [provider]  oxymorphone (OPANA ER) 10 MG 12 hr tablet Take 10 mg by mouth in the morning, at noon, and at bedtime. Patient not taking: Reported on 09/03/2022    [provider]     Positive ROS: All other systems have been reviewed and were otherwise negative with the exception of those mentioned in the HPI and as above.  Physical Exam:  There were no vitals filed for this visit. General: Alert, no acute distress Cardiovascular: No pedal edema Respiratory: No cyanosis, no use of accessory musculature GI: No organomegaly, abdomen is soft and non-tender Skin: No lesions in the area of chief complaint Neurologic: Sensation intact distally Psychiatric: Patient is competent for consent with normal mood and affect Lymphatic: No axillary or cervical lymphadenopathy  MUSCULOSKELETAL: Left ankle demonstrates some deformity.  Tender to palpation about the ankle.  She tolerates gentle range of motion of the ankle.  Distally foot is warm and well-perfused with intact sensation.  Assessment: Left ankle posttraumatic arthritis with disruption of the syndesmosis and widening of the distal tibiofibular joint   Plan: Plan for ankle arthrodesis with open treatment of distal tibiofibular joint and syndesmosis arthrodesis.  This is a very complex surgery.  The patient will likely require overnight monitoring in the hospital and possible SNF placement.  She will be nonweightbearing for 6 to 8 weeks.  We discussed the risks, benefits and alternatives of surgery which include but are not limited to wound healing complications, infection, nonunion, malunion, need  for further surgery, damage to surrounding structures and continued pain.  They understand there is no guarantees to an acceptable outcome.  After weighing these risks they opted to proceed with surgery.     Terance Hart, MD    09/09/2022 6:14 AM

## 2022-09-09 NOTE — Progress Notes (Signed)
Pt reports that she thinks she had a UTI the past couple of weeks. Patient states that she had back pain, increased frequency and her urine had an odor. Pt state she took leftover amoxicillin on Mon, Tue and Wed last week. Pt states she feels much better now. Dr Susa Simmonds notified. U/A sent per MD order. Dr. Andres Ege notified. Per Dr. Susa Simmonds ok to proceed with case today.

## 2022-09-09 NOTE — Op Note (Signed)
Ariana Jarvis female 62 y.o. 09/09/2022  PreOperative Diagnosis: Left posttraumatic tibiotalar arthritis Left syndesmosis disruption Left distal tibiofibular arthrosis  PostOperative Diagnosis: Same  PROCEDURE: Left tibiotalar arthrodesis Open treatment of left syndesmosis Left distal tibiofibular arthrodesis  SURGEON: Dub Mikes, MD  ASSISTANT: Jesse [place], PA-C was necessary for patient positioning, prep, drape, assistance reduction and placement of hardware  ANESTHESIA: General LMA anesthesia with peripheral nerve blockade  FINDINGS: See below  IMPLANTS: Paragon ankle arthrodesis plate, Zimmer small fragment screws  INDICATIONS:61 y.o. female sustained a trimalleolar ankle fracture several months ago and underwent open treatment.  She had her syndesmosis screws removed.  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  She had disruption of her syndesmosis and significant arthrosis within the syndesmosis.  Given her pain, deformity and disability she was indicated for surgery.  She did fail conservative treatment the form of boot immobilization, anti-inflammatories and activity modifications and had continued pain.   Patient understood the risks, benefits and alternatives to surgery which include but are not limited to wound healing complications, infection, nonunion, malunion, need for further surgery as well as damage to surrounding structures. They also understood the potential for continued pain in that there were no guarantees of acceptable outcome After weighing these risks the patient opted to proceed with surgery.  PROCEDURE: Patient was identified in the preoperative holding area.  The left leg was marked by myself.  Consent was signed by myself and the patient.  Block was performed by anesthesia in the preoperative holding area.  Patient was taken to the operative suite and placed supine on  the operative table.  General LMA anesthesia was induced without difficulty. Bump was placed under the operative hip and bone foam was used.  All bony prominences were well padded.  Tourniquet was placed on the operative thigh.  Preoperative antibiotics were given. The extremity was prepped and draped in the usual sterile fashion and surgical timeout was performed.  The limb was elevated and the tourniquet was inflated to 250 mmHg.  We began by making a longitudinal incision overlying the anterior aspect of the ankle joint for an anterior ankle approach.  The incision was carried sharply through skin and subcutaneous tissue.  Blunt dissection was used to mobilize skin flaps and the interval between the tibialis anterior and extensor houses longus tendon was identified and the retinacular tissue was incised in line with the incision.  The tibialis anterior tendon and extensor houses longus tendons were mobilized and the neurovascular bundle was identified and protected.  Then the dissection was carried sharply down to bone.  The anterior ankle joint capsule was incised in line with the incision and the ankle joint was opened.  Were able to create subperiosteal flaps medially and laterally to gain access to the ankle joint.  There is subluxation of the ankle joint and significant fibrous tissue within the medial gutter.  There was significant arthrosis and collapse of the lateral tibial plafond.  We are able to release the ligaments medially and laterally about the ankle joint and a lamina spreader was placed within the joint and the cartilage was fully removed from the dorsal aspect of the talus and the plafond of the tibia.  Then the joint was irrigated and all cartilage had was confirmed to be removed.  We were then able to trephinate the bony surfaces using a drill.  We then proceeded to inspect the syndesmosis.  There was gross instability through the syndesmosis.  From the anterior incision we are able to  remove a small area of the anterior aspect of the tibial incisura to gain access to the syndesmosis.  Fibrous tissue within the area of the syndesmosis was removed.  Again there was gross instability there.  A curette was used within the area to remove the cartilage from the medial aspect of the fibula and the inside sure of the tibia.  Then a drill was used to trephinate the bony surfaces at the distal tibiofibular joint.  Then a separate incision was made overlying the previously placed fibula plate laterally.  The incision was carried sharply through skin and subcutaneous tissue.  Blunt dissection was used to mobilize skin flaps were able to gain access to the lateral aspect of the plate.  2 holes were made where the syndesmosis screws were placed previously.  We then turned our attention back to the tibiotalar joint.  The tibiotalar joint was reduced under direct visualization a K wire was placed from medial tibia across the tibiotalar joint to stabilize the joint.  Fluoroscopy confirmed appropriate reduction of the tibiotalar joint.  Then once acceptable reduction was obtained a Paragon ankle arthrodesis plate was placed with a combination of locking and nonlocking screws fixating the fracture.  Good compression was obtained through the plate.  Then through the lateral incision were able to perform open reduction of the syndesmosis.  Again there was gross instability of the syndesmosis and widening.  Using a Weber clamp we are able to provisionally reduce the syndesmosis and hold it.  The syndesmosis was reduced under direct visualization.  Fluoroscopy confirmed appropriate position of the syndesmosis.    Then some DBM bone graft putty was placed within the syndesmosis.    Then we proceeded with the distal tibiofibular arthrodesis.  2 screws were placed through the previously placed arthrodesis plate to fixate the syndesmosis fusion.  This was done with fluoroscopic guidance to ensure acceptable  position of the screws.  There was good stability and bony contact after placement of the screws.  The remaining bone graft was placed within the lateral aspect of the tibiotalar joint and within the exposed area of the lateral incisor and syndesmosis.  Then the wound was irrigated copiously with normal saline.  Final fluoroscopic images were obtained.  The wounds were then closed in a layered fashion using 2-0 Vicryl for the capsular tissue and retinaculum.  The skin was retained tissue was closed with 3-0 Monocryl and 3-0 nylon suture.  Tourniquet was released.  Soft dressing was placed.  Short leg splint was placed.  She was awakened from anesthesia and taken recovery in stable condition.  No complications.  Counts were correct.  POST OPERATIVE INSTRUCTIONS: Nonweightbearing to operative extremity Keep splint dry and intact Follow-up in 2 weeks for splint removal, suture removal if appropriate and placement of a short leg cast.  Nonweightbearing 6 to 8 weeks  TOURNIQUET TIME: Less than 2 hours  BLOOD LOSS:  Minimal         DRAINS: none         SPECIMEN: none       COMPLICATIONS:  * No complications entered in OR log *         Disposition: PACU - hemodynamically stable.         Condition: stable

## 2022-09-09 NOTE — Progress Notes (Signed)
Verbal order diet via telephone : MD Dub Mikes

## 2022-09-09 NOTE — Anesthesia Postprocedure Evaluation (Signed)
Anesthesia Post Note  Patient: Gathel Guinan  Procedure(s) Performed: LEFT TIBIOTALAR JOINT ARTHRODESIS, DISTAL TIBIOFIBULAR JOINT ARTHRODESIS (Left: Ankle) OPEN REDUCTION OF SYNDESMOSIS (Left: Ankle)     Patient location during evaluation: PACU Anesthesia Type: Regional and General Level of consciousness: awake and alert Pain management: pain level controlled Vital Signs Assessment: post-procedure vital signs reviewed and stable Respiratory status: spontaneous breathing, nonlabored ventilation, respiratory function stable and patient connected to nasal cannula oxygen Cardiovascular status: blood pressure returned to baseline and stable Postop Assessment: no apparent nausea or vomiting Anesthetic complications: no   No notable events documented.  Last Vitals:  Vitals:   09/09/22 1015 09/09/22 1032  BP: (!) 156/67 (!) 142/58  Pulse: 81 78  Resp: 18 17  Temp: 36.6 C 36.8 C  SpO2: 94% 98%    Last Pain:  Vitals:   09/09/22 1032  TempSrc: Oral  PainSc: 7                  Kierria Feigenbaum

## 2022-09-09 NOTE — Anesthesia Procedure Notes (Signed)
Procedure Name: LMA Insertion Date/Time: 09/09/2022 7:42 AM  Performed by: Evlyn Courier, CRNAPre-anesthesia Checklist: Patient identified, Emergency Drugs available, Suction available and Patient being monitored Patient Re-evaluated:Patient Re-evaluated prior to induction Oxygen Delivery Method: Circle System Utilized Preoxygenation: Pre-oxygenation with 100% oxygen Induction Type: IV induction Ventilation: Mask ventilation without difficulty LMA: LMA inserted LMA Size: 4.0 Number of attempts: 1 Placement Confirmation: positive ETCO2 Tube secured with: Tape Dental Injury: Teeth and Oropharynx as per pre-operative assessment

## 2022-09-09 NOTE — Evaluation (Signed)
Physical Therapy Evaluation Patient Details Name: Ariana Jarvis MRN: [id number] DOB: [birth year] Today's Date: 09/09/2022  History of Present Illness  62 yo female s/p L ankle arthrodesis with open treatment of distal tibiofibular joint and syndesmosis arthrodesis on 5/28. PMH includes anxiety, OA, back pain, depression, HLD, HTN, L ankle ORIF 03/14/2022 with hardware removal 05/16/2022, L ORIF radial fx 03/14/2022.  Clinical Impression   Pt presents with LLE pain post-op, impaired balance given WB precautions, and decreased activity tolerance. Pt to benefit from acute PT to address deficits. Pt ambulated short room distance with min cues for form/safety, pt limited in tolerance at this time given pain. Pt may benefit from w/c and knee scooter, would need to practice with knee scooter prior to d/c to ensure pt is steady enough to use it. PT to progress mobility as tolerated, and will continue to follow acutely.         Recommendations for follow up therapy are one component of a multi-disciplinary discharge planning process, led by the attending physician.  Recommendations may be updated based on patient status, additional functional criteria and insurance authorization.  Follow Up Recommendations       Assistance Recommended at Discharge Intermittent Supervision/Assistance  Patient can return home with the following  A little help with walking and/or transfers;A little help with bathing/dressing/bathroom    Equipment Recommendations Other (comment);Wheelchair (measurements PT);Wheelchair cushion (measurements PT) (knee scooter)  Recommendations for Other Services       Functional Status Assessment Patient has had a recent decline in their functional status and demonstrates the ability to make significant improvements in function in a reasonable and predictable amount of time.     Precautions / Restrictions Precautions Precautions: Fall Required Braces or Orthoses:  Splint/Cast Splint/Cast: L lower leg Restrictions Weight Bearing Restrictions: Yes LLE Weight Bearing: Non weight bearing      Mobility  Bed Mobility Overal bed mobility: Needs Assistance Bed Mobility: Supine to Sit, Sit to Supine     Supine to sit: Supervision, HOB elevated Sit to supine: Supervision, HOB elevated   General bed mobility comments: increased time to lift LLE into and out fo bed    Transfers Overall transfer level: Needs assistance Equipment used: Rolling walker (2 wheels) Transfers: Sit to/from Stand Sit to Stand: Min guard           General transfer comment: close guard for safety, cues for NWB LLE and hand placement when rising. Stand x2, from EOB and toilet.    Ambulation/Gait Ambulation/Gait assistance: Min guard Gait Distance (Feet): 20 Feet Assistive device: Rolling walker (2 wheels) Gait Pattern/deviations: Step-to pattern, Trunk flexed Gait velocity: decr     General Gait Details: close guard for safety, min cues for NWB LLE and placement in RW  Stairs            Wheelchair Mobility    Modified Rankin (Stroke Patients Only)       Balance Overall balance assessment: History of Falls, Needs assistance Sitting-balance support: No upper extremity supported, Feet supported Sitting balance-Leahy Scale: Good     Standing balance support: Bilateral upper extremity supported, During functional activity Standing balance-Leahy Scale: Fair                               Pertinent Vitals/Pain Pain Assessment Pain Assessment: 0-10 Pain Score: 6  Pain Location: L ankle Pain Descriptors / Indicators: Sore Pain Intervention(s): Limited activity within patient's tolerance, Monitored during session,  Repositioned    Home Living Family/patient expects to be discharged to:: Private residence Living Arrangements: Alone Available Help at Discharge: Family;Available PRN/intermittently Type of Home: Apartment Home Access: Level  entry       Home Layout: One level Home Equipment: Tub bench;BSC/3in1;Grab bars - tub/shower      Prior Function Prior Level of Function : Independent/Modified Independent             Mobility Comments: went to [facility]PP[facility] prior to surgery for gait training       Hand Dominance   Dominant Hand: Right    Extremity/Trunk Assessment   Upper Extremity Assessment Upper Extremity Assessment: Defer to OT evaluation    Lower Extremity Assessment Lower Extremity Assessment: Generalized weakness;LLE deficits/detail LLE: Unable to fully assess due to immobilization;Unable to fully assess due to pain    Cervical / Trunk Assessment Cervical / Trunk Assessment: Normal  Communication   Communication: No difficulties  Cognition Arousal/Alertness: Awake/alert Behavior During Therapy: WFL for tasks assessed/performed Overall Cognitive Status: Within Functional Limits for tasks assessed                                          General Comments      Exercises     Assessment/Plan    PT Assessment Patient needs continued PT services  PT Problem List Decreased strength;Decreased mobility;Decreased activity tolerance;Decreased balance;Decreased knowledge of use of DME;Pain;Decreased safety awareness;Decreased range of motion       PT Treatment Interventions DME instruction;Therapeutic activities;Gait training;Therapeutic exercise;Patient/family education;Balance training;Functional mobility training;Neuromuscular re-education    PT Goals (Current goals can be found in the Care Plan section)  Acute Rehab PT Goals Patient Stated Goal: home PT Goal Formulation: With patient Time For Goal Achievement: 09/23/22 Potential to Achieve Goals: Good    Frequency Min 4X/week     Co-evaluation               AM-PAC PT "6 Clicks" Mobility  Outcome Measure Help needed turning from your back to your side while in a flat bed without using bedrails?: A Little Help  needed moving from lying on your back to sitting on the side of a flat bed without using bedrails?: A Little Help needed moving to and from a bed to a chair (including a wheelchair)?: A Little Help needed standing up from a chair using your arms (e.g., wheelchair or bedside chair)?: A Little Help needed to walk in hospital room?: A Little Help needed climbing 3-5 steps with a railing? : A Little 6 Click Score: 18    End of Session   Activity Tolerance: Patient tolerated treatment well Patient left: in bed;with call bell/phone within reach;with bed alarm set;with family/visitor present Nurse Communication: Mobility status PT Visit Diagnosis: Other abnormalities of gait and mobility (R26.89)    Time: 1610-9604 PT Time Calculation (min) (ACUTE ONLY): 26 min   Charges:   PT Evaluation $PT Eval Low Complexity: 1 Low PT Treatments $Therapeutic Activity: 8-22 mins        Marye Round, PT DPT Acute Rehabilitation Services Secure Chat Preferred  Office [phone]   Truddie Coco 09/09/2022, 4:23 PM

## 2022-09-09 NOTE — Plan of Care (Signed)

## 2022-09-09 NOTE — Transfer of Care (Signed)
Immediate Anesthesia Transfer of Care Note  Patient: Ariana Jarvis  Procedure(s) Performed: LEFT TIBIOTALAR JOINT ARTHRODESIS, DISTAL TIBIOFIBULAR JOINT ARTHRODESIS (Left: Ankle) OPEN REDUCTION OF SYNDESMOSIS (Left: Ankle)  Patient Location: PACU  Anesthesia Type:GA combined with regional for post-op pain  Level of Consciousness: awake, alert , oriented, and patient cooperative  Airway & Oxygen Therapy: Patient Spontanous Breathing and Patient connected to nasal cannula oxygen  Post-op Assessment: Report given to RN, Post -op Vital signs reviewed and stable, and Patient moving all extremities X 4  Post vital signs: Reviewed and stable  Last Vitals:  Vitals Value Taken Time  BP 154/86 09/09/22 0945  Temp    Pulse 85 09/09/22 0949  Resp 16 09/09/22 0949  SpO2 96 % 09/09/22 0949  Vitals shown include unvalidated device data.  Last Pain:  Vitals:   09/09/22 0700  PainSc: 2          Complications: No notable events documented.

## 2022-09-09 NOTE — Anesthesia Procedure Notes (Signed)
Anesthesia Regional Block: Adductor canal block   Pre-Anesthetic Checklist: , timeout performed,  Correct Patient, Correct Site, Correct Laterality,  Correct Procedure, Correct Position, site marked,  Risks and benefits discussed,  Surgical consent,  Pre-op evaluation,  At surgeon's request and post-op pain management  Laterality: Left  Prep: chloraprep       Needles:  Injection technique: Single-shot  Needle Type: Echogenic Stimulator Needle     Needle Length: 5cm  Needle Gauge: 22     Additional Needles:   Procedures:,,,, ultrasound used (permanent image in chart),,    Narrative:  Start time: 09/09/2022 7:25 AM End time: 09/09/2022 7:30 AM Injection made incrementally with aspirations every 5 mL.  Performed by: Personally  Anesthesiologist: Bethena Midget, MD  Additional Notes: Functioning IV was confirmed and monitors were applied.  A 50mm 22ga Arrow echogenic stimulator needle was used. Sterile prep and drape,hand hygiene and sterile gloves were used. Ultrasound guidance: relevant anatomy identified, needle position confirmed, local anesthetic spread visualized around nerve(s)., vascular puncture avoided.  Image printed for medical record. Negative aspiration and negative test dose prior to incremental administration of local anesthetic. The patient tolerated the procedure well.

## 2022-09-10 ENCOUNTER — Encounter (HOSPITAL_COMMUNITY): Payer: Self-pay | Admitting: Orthopaedic Surgery

## 2022-09-10 DIAGNOSIS — M19172 Post-traumatic osteoarthritis, left ankle and foot: Secondary | ICD-10-CM | POA: Diagnosis not present

## 2022-09-10 MED ORDER — ASPIRIN 325 MG PO TABS: ORAL_TABLET | ORAL | 0 refills | Status: AC

## 2022-09-10 MED ORDER — METHOCARBAMOL 750 MG PO TABS: 750.0000 mg | ORAL_TABLET | Freq: Three times a day (TID) | ORAL | 0 refills | Status: AC | PRN

## 2022-09-10 NOTE — Progress Notes (Signed)
     Ariana Jarvis is a 62 y.o. female   Orthopaedic diagnosis: Status post left tibiotalar and distal tibiofibular arthrodesis 5/2 11/2022  Subjective: Patient appears comfortable in bed.  Pain well controlled.  She feels the nerve block is beginning to wear off.  She did work with physical therapy yesterday and feels she did well with that.  She is hopeful for discharge home today to the help of family members.  We did provide her a DME equipment prescription prior to surgery for a knee scooter and other equipment but she did not obtain it.  Denies shortness of breath, chest pain, abdominal pain.  Tolerating by mouth diet well.   Objectyive: Vitals:   09/10/22 0030 09/10/22 0510  BP: (!) 123/58 (!) 142/66  Pulse: 71 67  Resp: 15 15  Temp: 98.3 F (36.8 C) 99.5 F (37.5 C)  SpO2: 91% 95%     Exam: Awake and alert Respirations even and unlabored No acute distress  Examination of left lower extremity demonstrates well fitted, clean, and intact lower external he splint.  Splint was not removed.  Exposed skin is benign.  She is able to wiggle the toes.  Tolerates gentle range of motion at the knee.  Proximal calf soft and nontender.  Endorses altered sensation to light touch about the toes due to nerve block.  Warm and well perfused.  Assessment: Postop day 1 status post the above, suitable for discharge home after working with therapies again today   Plan: -Nonweightbearing left lower extremity -Keep splint clean, dry, intact -Discussed outpatient pain regimen.  She will continue with her baseline oxycodone for pain.  We will add Robaxin for spasms when necessary. -Aspirin for DVT prophylaxis 30 days  Plan for outpatient follow-up 2 weeks from surgery with Dr. Susa Simmonds for reevaluation out of the splint, suture removal appropriate, and x-rays on arrival. Likely placement of a cast at that time.  We will see what DME equipment we can dispense her in the hospital prior to  discharge.  She is requesting a knee scooter and wheelchair.    Doratha Mcswain J. [place], PA-C

## 2022-09-10 NOTE — Final Progress Note (Signed)
    Durable Medical Equipment  (From admission, onward)           Start     Ordered   09/10/22 1005  For home use only DME Other see comment  Once       Comments: Knee scooter  Question:  Length of Need  Answer:  6 Months   09/10/22 1005   09/09/22 1019  DME Walker rolling  Once       Question Answer Comment  Walker: With 5 Inch Wheels   Patient needs a walker to treat with the following condition Arthritis of left ankle      09/09/22 1018

## 2022-09-10 NOTE — Progress Notes (Signed)
Physical Therapy Treatment Patient Details Name: Ariana Jarvis MRN: [id number] DOB: 06-08-1960 Today's Date: 09/10/2022   History of Present Illness 62 yo female s/p L ankle arthrodesis with open treatment of distal tibiofibular joint and syndesmosis arthrodesis on 5/28. PMH includes anxiety, OA, back pain, depression, HLD, HTN, L ankle ORIF 03/14/2022 with hardware removal 05/16/2022, L ORIF radial fx 03/14/2022.    PT Comments    Patient resting in bed at start of session and agreeable to PT visit for additional gait and knee scooter training. Pt completed bed mobility at Mod I level and sit<>stand transfers with RW at supervision level from EOB and toilet. Supervision to ambulate short distance with RW and good use of Ue's to maintain NWB on Lt LE with gait. Reviewed safety concerns with use of knee scooter and importance of engaging brake for safe transfer on/off scooter. Pt completed bed>scooter transfer and scooter>recliner transfer with min guard and cues for safety. Pt was able to self propel scooter ~200' with min guard/supervision and maintained safe velocity and good control with Rt/Lt turns. Educated pt on importance of elevating Lt LE at rest for edema management. EOS pt resting in recliner, call bell within reach and all needs met. Will continue to progress as able.    Recommendations for follow up therapy are one component of a multi-disciplinary discharge planning process, led by the attending physician.  Recommendations may be updated based on patient status, additional functional criteria and insurance authorization.  Follow Up Recommendations       Assistance Recommended at Discharge Intermittent Supervision/Assistance  Patient can return home with the following A little help with walking and/or transfers;A little help with bathing/dressing/bathroom   Equipment Recommendations   (knee scooter)    Recommendations for Other Services       Precautions / Restrictions  Precautions Precautions: Fall Required Braces or Orthoses: Splint/Cast Splint/Cast: L lower leg Restrictions Weight Bearing Restrictions: Yes LLE Weight Bearing: Non weight bearing     Mobility  Bed Mobility Overal bed mobility: Modified Independent Bed Mobility: Supine to Sit     Supine to sit: Modified independent (Device/Increase time)     General bed mobility comments: use of bed features    Transfers Overall transfer level: Needs assistance Equipment used: Rolling walker (2 wheels) Transfers: Sit to/from Stand Sit to Stand: Supervision           General transfer comment: sup for safety, pt using bil UE to power up. Pt stood to RW and to knee scooter. cues for locking brakes on scooter with stand to transition to knee scooter.    Ambulation/Gait Ambulation/Gait assistance: Supervision Gait Distance (Feet): 20 Feet Assistive device: Rolling walker (2 wheels) Gait Pattern/deviations: Step-to pattern Gait velocity: decr     General Gait Details: supervision with RW for short gait in room to amb to bathroom and back to recliner. cues to keep walker closer to BOS. pt maintained NWB on Lt LE.   Stairs             [employer] mobility:  (knee scooter) Wheelchair propulsion: Right lower extremity, Both upper extremities Wheelchair parts: Supervision/cueing Distance: 300 Wheelchair Assistance Details (indicate cue type and reason): pt using Knee Scooter to self propel with Rt LE on floor and Bil UE controlling direction and brakes to control safe speed. pt able to negotiate Lt/Rt turns in hallway and room. cues for safety with backing up scooter by recliner for safe transfer on/off scooter.  Modified Rankin (Stroke  Patients Only)       Balance Overall balance assessment: History of Falls, Needs assistance Sitting-balance support: No upper extremity supported, Feet supported Sitting balance-Leahy Scale: Good      Standing balance support: Bilateral upper extremity supported, During functional activity Standing balance-Leahy Scale: Fair                              Cognition Arousal/Alertness: Awake/alert Behavior During Therapy: WFL for tasks assessed/performed Overall Cognitive Status: Within Functional Limits for tasks assessed                                          Exercises      General Comments General comments (skin integrity, edema, etc.): VSS on RA      Pertinent Vitals/Pain Pain Assessment Pain Assessment: No/denies pain Pain Location: L ankle Pain Intervention(s): Limited activity within patient's tolerance, Monitored during session, Repositioned (reports still numb)    Home Living Family/patient expects to be discharged to:: Private residence Living Arrangements: Alone Available Help at Discharge: Family;Available PRN/intermittently Type of Home: Apartment Home Access: Level entry       Home Layout: One level Home Equipment: Tub bench;BSC/3in1;Grab bars - tub/shower      Prior Function            PT Goals (current goals can now be found in the care plan section) Acute Rehab PT Goals Patient Stated Goal: home PT Goal Formulation: With patient Time For Goal Achievement: 09/23/22 Potential to Achieve Goals: Good Progress towards PT goals: Progressing toward goals    Frequency    Min 4X/week      PT Plan Current plan remains appropriate    Co-evaluation              AM-PAC PT "6 Clicks" Mobility   Outcome Measure  Help needed turning from your back to your side while in a flat bed without using bedrails?: A Little Help needed moving from lying on your back to sitting on the side of a flat bed without using bedrails?: A Little Help needed moving to and from a bed to a chair (including a wheelchair)?: A Little Help needed standing up from a chair using your arms (e.g., wheelchair or bedside chair)?: A Little Help  needed to walk in hospital room?: A Little Help needed climbing 3-5 steps with a railing? : A Little 6 Click Score: 18    End of Session Equipment Utilized During Treatment: Gait belt Activity Tolerance: Patient tolerated treatment well Patient left: with call bell/phone within reach;in chair Nurse Communication: Mobility status PT Visit Diagnosis: Other abnormalities of gait and mobility (R26.89)     Time: 9629-5284 PT Time Calculation (min) (ACUTE ONLY): 25 min  Charges:  $Gait Training: 8-22 mins $Therapeutic Activity: 8-22 mins                     Wynn Maudlin, DPT Acute Rehabilitation Services Office [phone]  09/10/22 10:39 AM

## 2022-09-10 NOTE — Evaluation (Signed)
Occupational Therapy Evaluation Patient Details Name: Ariana Jarvis MRN: [id number] DOB: October 31, 1960 Today's Date: 09/10/2022   History of Present Illness 62 yo female s/p L ankle arthrodesis with open treatment of distal tibiofibular joint and syndesmosis arthrodesis on 5/28. PMH includes anxiety, OA, back pain, depression, HLD, HTN, L ankle ORIF 03/14/2022 with hardware removal 05/16/2022, L ORIF radial fx 03/14/2022.   Clinical Impression   Pt admitted for above dx, PTA patient lived alone and ambulated no AD and independent in ADLs. Pt currently presenting with impaired balance due to NWB LLE, able to complete functional transfers with min guard to supervision assist, Discussed reducing clutter in home if pt were DC with knee scooter. Trialed Knee scooter ambulation, pt needing more practice, bumping into walls and cords. OT to continue to follow patient acutely to address above deficits and help transition to next level of care. No follow-up OT recommended at this time.      Recommendations for follow up therapy are one component of a multi-disciplinary discharge planning process, led by the attending physician.  Recommendations may be updated based on patient status, additional functional criteria and insurance authorization.   Assistance Recommended at Discharge Set up Supervision/Assistance  Patient can return home with the following Assist for transportation;Assistance with cooking/housework    Functional Status Assessment  Patient has had a recent decline in their functional status and demonstrates the ability to make significant improvements in function in a reasonable and predictable amount of time.  Equipment Recommendations  None recommended by OT (pt has rec DME)    Recommendations for Other Services       Precautions / Restrictions Precautions Precautions: Fall Required Braces or Orthoses: Splint/Cast Splint/Cast: L lower leg Restrictions Weight Bearing Restrictions:  Yes LLE Weight Bearing: Non weight bearing      Mobility Bed Mobility Overal bed mobility: Modified Independent                  Transfers Overall transfer level: Needs assistance Equipment used: Rolling walker (2 wheels) Transfers: Sit to/from Stand Sit to Stand: Supervision           General transfer comment: STS x2 supervision, knee scoot transfer x2 Min guard with assist to lock brakes.      Balance Overall balance assessment: History of Falls, Needs assistance Sitting-balance support: No upper extremity supported, Feet supported Sitting balance-Leahy Scale: Good     Standing balance support: Bilateral upper extremity supported, During functional activity Standing balance-Leahy Scale: Fair                             ADL either performed or assessed with clinical judgement   ADL Overall ADL's : Needs assistance/impaired Eating/Feeding: Independent;Sitting   Grooming: Standing;Min guard   Upper Body Bathing: Independent;Sitting   Lower Body Bathing: Min guard;Sitting/lateral leans   Upper Body Dressing : Independent;Sitting   Lower Body Dressing: Supervision/safety;Set up;Sitting/lateral leans   Toilet Transfer: Ambulation;Min guard;BSC/3in1   Toileting- Clothing Manipulation and Hygiene: Sitting/lateral lean;Set up;Supervision/safety   Tub/ Shower Transfer: Min guard;Ambulation   Functional mobility during ADLs: Min guard (+ knee scooter)       Vision Baseline Vision/History: 1 Wears glasses       Perception     Praxis      Pertinent Vitals/Pain Pain Assessment Pain Assessment: No/denies pain     Hand Dominance Right   Extremity/Trunk Assessment Upper Extremity Assessment Upper Extremity Assessment: Overall WFL for tasks assessed  Lower Extremity Assessment Lower Extremity Assessment: LLE deficits/detail;Generalized weakness LLE Deficits / Details: LLE residual numbness from anasthesia LLE: Unable to fully assess  due to immobilization;Unable to fully assess due to pain   Cervical / Trunk Assessment Cervical / Trunk Assessment: Normal   Communication Communication Communication: No difficulties   Cognition Arousal/Alertness: Awake/alert Behavior During Therapy: WFL for tasks assessed/performed Overall Cognitive Status: Within Functional Limits for tasks assessed                                       General Comments  VSS on RA    Exercises     Shoulder Instructions      Home Living Family/patient expects to be discharged to:: Private residence Living Arrangements: Alone Available Help at Discharge: Family;Available PRN/intermittently Type of Home: Apartment Home Access: Level entry     Home Layout: One level     Bathroom Shower/Tub: Chief Strategy Officer: Standard Bathroom Accessibility: Yes How Accessible: Accessible via wheelchair Home Equipment: Tub bench;BSC/3in1;Grab bars - tub/shower          Prior Functioning/Environment Prior Level of Function : Independent/Modified Independent             Mobility Comments: went to OPPT prior to surgery for gait training ADLs Comments: ind in bADLs/iADLs        OT Problem List: Pain;Impaired balance (sitting and/or standing)      OT Treatment/Interventions: Self-care/ADL training;Therapeutic exercise;Therapeutic activities;DME and/or AE instruction;Patient/family education;Balance training    OT Goals(Current goals can be found in the care plan section) Acute Rehab OT Goals Patient Stated Goal: to go home OT Goal Formulation: With patient Time For Goal Achievement: 09/24/22 Potential to Achieve Goals: Good ADL Goals Pt Will Perform Grooming: standing;with supervision Pt Will Perform Lower Body Bathing: sitting/lateral leans;with supervision Pt Will Perform Lower Body Dressing: with supervision;with set-up;sitting/lateral leans Pt Will Transfer to Toilet: with supervision  OT Frequency:  Min 2X/week    Co-evaluation              AM-PAC OT "6 Clicks" Daily Activity     Outcome Measure Help from another person eating meals?: None Help from another person taking care of personal grooming?: A Little Help from another person toileting, which includes using toliet, bedpan, or urinal?: A Little Help from another person bathing (including washing, rinsing, drying)?: A Little Help from another person to put on and taking off regular upper body clothing?: None Help from another person to put on and taking off regular lower body clothing?: A Little 6 Click Score: 20   End of Session Equipment Utilized During Treatment: Other (comment) (knee scooter) Nurse Communication: Mobility status  Activity Tolerance: Patient tolerated treatment well Patient left: in bed;with call bell/phone within reach;with nursing/sitter in room  OT Visit Diagnosis: Unsteadiness on feet (R26.81);Pain Pain - Right/Left: Left Pain - part of body: Leg                Time: 1610-9604 OT Time Calculation (min): 20 min Charges:  OT General Charges $OT Visit: 1 Visit OT Evaluation $OT Eval Low Complexity: 1 Low  09/10/2022  AB, OTR/L  Acute Rehabilitation Services  Office: [phone]   Tristan Schroeder 09/10/2022, 10:07 AM

## 2022-09-10 NOTE — Progress Notes (Signed)
Discharge instructions given. Patient verbalized understanding and all questions were answered.  ?

## 2022-09-10 NOTE — Discharge Summary (Signed)
Patient ID: Ariana Jarvis MRN: [id number] DOB/AGE: [birth year] 62 y.o.  Admit date: 09/09/2022 Discharge date: 09/10/2022  Admission Diagnoses:  Principal Problem:   Arthritis of left ankle   Discharge Diagnoses:  Same  Past Medical History:  Diagnosis Date   Anxiety    Arthritis    back   Back pain    Chronic back pain    Depression    Gastric ulcer    GERD (gastroesophageal reflux disease)    HLD (hyperlipidemia)    Hypertension     Surgeries: Procedure(s): LEFT TIBIOTALAR JOINT ARTHRODESIS, DISTAL TIBIOFIBULAR JOINT ARTHRODESIS OPEN REDUCTION OF SYNDESMOSIS on 09/09/2022   Consultants: None  Discharged Condition: Improved  Hospital Course: Ariana Jarvis is an 62 y.o. female who was admitted 09/09/2022 for operative treatment ofArthritis of left ankle. Patient has severe unremitting pain that affects sleep, daily activities, and work/hobbies. After pre-op clearance the patient was taken to the operating room on 09/09/2022 and underwent  Procedure(s): LEFT TIBIOTALAR JOINT ARTHRODESIS, DISTAL TIBIOFIBULAR JOINT ARTHRODESIS OPEN REDUCTION OF SYNDESMOSIS.    Patient was given perioperative antibiotics:  Anti-infectives (From admission, onward)    Start     Dose/Rate Route Frequency Ordered Stop   09/09/22 1400  ceFAZolin (ANCEF) IVPB 2g/100 mL premix        2 g 200 mL/hr over 30 Minutes Intravenous Every 6 hours 09/09/22 1018 09/10/22 0426   09/09/22 0600  ceFAZolin (ANCEF) IVPB 2g/100 mL premix        2 g 200 mL/hr over 30 Minutes Intravenous On call to O.R. 09/09/22 4098 09/09/22 0815        Patient was given sequential compression devices, early ambulation, and chemoprophylaxis to prevent DVT.  Patient benefited maximally from hospital stay and there were no complications.    Recent vital signs: Patient Vitals for the past 24 hrs:  BP Temp Temp src Pulse Resp SpO2  09/10/22 0912 129/74 (!) 97.5 F (36.4 C) Oral 78 16 97 %  09/10/22 0510 (!) 142/66  99.5 F (37.5 C) Oral 67 15 95 %  09/10/22 0030 (!) 123/58 98.3 F (36.8 C) -- 71 15 91 %  09/09/22 2216 137/65 -- -- -- -- --  09/09/22 1943 121/61 98.2 F (36.8 C) Oral 76 16 (!) 89 %  09/09/22 1447 131/63 98.1 F (36.7 C) Oral 76 -- 90 %     Recent laboratory studies:  Recent Labs    09/09/22 0642  WBC 8.1  HGB 13.8  HCT 41.7  PLT 192  NA 136  K 3.7  CL 102  CO2 27  BUN 14  CREATININE 0.90  GLUCOSE 134*  CALCIUM 9.9     Discharge Medications:   Allergies as of 09/10/2022       Reactions   Tramadol Itching        Medication List     TAKE these medications    amoxicillin-clavulanate 875-125 MG tablet Commonly known as: AUGMENTIN Take 1 tablet by mouth every 12 (twelve) hours.   aspirin 325 MG tablet Commonly known as: Bayer Aspirin Take 1 tablet by mouth for 30 DAYS for blood clot prevention   atenolol 100 MG tablet Commonly known as: TENORMIN Take 100 mg by mouth daily.   chlorhexidine 4 % external liquid Commonly known as: HIBICLENS Apply 15 mLs (1 Application total) topically as directed for 30 doses. Use as directed daily for 5 days every other week for 6 weeks.   citalopram 40 MG tablet Commonly known as: CELEXA Take 40  mg by mouth daily.   Hair Skin & Nails Tabs Take 2 tablets by mouth daily.   hydrALAZINE 50 MG tablet Commonly known as: APRESOLINE Take 50 mg by mouth in the morning and at bedtime.   lisinopril-hydrochlorothiazide 20-12.5 MG tablet Commonly known as: ZESTORETIC Take 1 tablet by mouth daily.   meloxicam 15 MG tablet Commonly known as: MOBIC Take 15 mg by mouth daily.   methocarbamol 750 MG tablet Commonly known as: Robaxin-750 Take 1 tablet (750 mg total) by mouth every 8 (eight) hours as needed for muscle spasms.   mupirocin ointment 2 % Commonly known as: BACTROBAN Place 1 Application into the nose 2 (two) times daily for 60 doses. Use as directed 2 times daily for 5 days every other week for 6 weeks.    omeprazole 40 MG capsule Commonly known as: PRILOSEC Take 40 mg by mouth daily.   oxyCODONE 15 MG immediate release tablet Commonly known as: ROXICODONE Take 15 mg by mouth every 4 (four) hours as needed for pain.   oxymorphone 10 MG 12 hr tablet Commonly known as: OPANA ER Take 10 mg by mouth in the morning, at noon, and at bedtime.   rosuvastatin 5 MG tablet Commonly known as: CRESTOR Take 5 mg by mouth daily.   solifenacin 10 MG tablet Commonly known as: VESICARE Take 10 mg by mouth daily.   Vitamin D (Ergocalciferol) 1.25 MG (50000 UNIT) Caps capsule Commonly known as: DRISDOL Take 50,000 Units by mouth once a week.               Durable Medical Equipment  (From admission, onward)           Start     Ordered   09/10/22 1050  For home use only DME lightweight manual wheelchair with seat cushion  Once       Comments: Patient suffers from left tibiotalar and distal tibiofibular arthrodesis 5/2 11/2022 which impairs their ability to perform daily activities like bathing in the home.  A walker will not resolve  issue with performing activities of daily living. A wheelchair will allow patient to safely perform daily activities. Patient is not able to propel themselves in the home using a standard weight wheelchair due to general weakness. Patient can self propel in the lightweight wheelchair. Length of need 6 months . Accessories: elevating leg rests (ELRs), wheel locks, extensions and anti-tippers.   09/10/22 1051   09/10/22 1005  For home use only DME Other see comment  Once       Comments: Knee scooter  Question:  Length of Need  Answer:  6 Months   09/10/22 1005   09/09/22 1019  DME Walker rolling  Once       Question Answer Comment  Walker: With 5 Inch Wheels   Patient needs a walker to treat with the following condition Arthritis of left ankle      09/09/22 1018              Discharge Care Instructions  (From admission, onward)            Start     Ordered   09/10/22 0000  Non weight bearing       Question Answer Comment  Laterality left   Extremity Lower      09/10/22 1108            Diagnostic Studies: DG Ankle Complete Left  Result Date: 09/09/2022 CLINICAL DATA:  ORIF left ankle arthrodesis. EXAM: LEFT  ANKLE COMPLETE - 3+ VIEW COMPARISON:  None Available. FINDINGS: Multiple intraoperative fluoroscopic images. Total fluoroscopic time was 31 seconds. Fluoroscopic dose was 0.83 mGy. IMPRESSION: Multiple intraoperative fluoroscopic images for left ankle ORIF. Electronically Signed   By: Larose Hires D.O.   On: 09/09/2022 11:01   DG C-Arm 1-60 Min-No Report  Result Date: 09/09/2022 Fluoroscopy was utilized by the requesting physician.  No radiographic interpretation.    Disposition: Discharge disposition: 01-Home or Self Care     -Nonweightbearing left lower extremity -Keep splint clean, dry, intact -Discussed outpatient pain regimen.  She will continue with her baseline oxycodone for pain.  We will add Robaxin for spasms when necessary. -Aspirin for DVT prophylaxis 30 days   Plan for outpatient follow-up 2 weeks from surgery with Dr. Susa Simmonds for reevaluation out of the splint, suture removal appropriate, and x-rays on arrival. Likely placement of a cast at that time.  Discharge Instructions     Call MD / Call 911   Complete by: As directed    If you experience chest pain or shortness of breath, CALL 911 and be transported to the hospital emergency room.  If you develope a fever above 101 F, pus (white drainage) or increased drainage or redness at the wound, or calf pain, call your surgeon's office.   Constipation Prevention   Complete by: As directed    Drink plenty of fluids.  Prune juice may be helpful.  You may use a stool softener, such as Colace (over the counter) 100 mg twice a day.  Use MiraLax (over the counter) for constipation as needed.   Diet - low sodium heart healthy   Complete by: As directed     Non weight bearing   Complete by: As directed    Laterality: left   Extremity: Lower   Post-operative opioid taper instructions:   Complete by: As directed    POST-OPERATIVE OPIOID TAPER INSTRUCTIONS: It is important to wean off of your opioid medication as soon as possible. If you do not need pain medication after your surgery it is ok to stop day one. Opioids include: Codeine, Hydrocodone(Norco, Vicodin), Oxycodone(Percocet, oxycontin) and hydromorphone amongst others.  Long term and even short term use of opiods can cause: Increased pain response Dependence Constipation Depression Respiratory depression And more.  Withdrawal symptoms can include Flu like symptoms Nausea, vomiting And more Techniques to manage these symptoms Hydrate well Eat regular healthy meals Stay active Use relaxation techniques(deep breathing, meditating, yoga) Do Not substitute Alcohol to help with tapering If you have been on opioids for less than two weeks and do not have pain than it is ok to stop all together.  Plan to wean off of opioids This plan should start within one week post op of your joint replacement. Maintain the same interval or time between taking each dose and first decrease the dose.  Cut the total daily intake of opioids by one tablet each day Next start to increase the time between doses. The last dose that should be eliminated is the evening dose.           Follow-up Information     Terance Hart, MD Follow up in 2 week(s).   Specialty: Orthopedic Surgery Contact information: [address] [place] [address] [phone]                  Signed: Nelle Sayed J [place] 09/10/2022, 11:08 AM

## 2022-09-10 NOTE — Discharge Instructions (Signed)

## 2022-09-10 NOTE — TOC Transition Note (Addendum)
Transition of Care [facility]) - CM/SW Discharge Note   Patient Details  Name: Ariana Jarvis MRN: [id number] Date of Birth: 1961/01/02  Transition of [facility]) CM/SW Contact:  Epifanio Lesches, RN Phone Number: 09/10/2022, 11:12 AM   Clinical Narrative:     Patient will DC to: home Anticipated DC date: 09/10/2022 Family notified: yes Transport by: car     - s/p L ankle arthrodesis, 5/28 Per MD patient ready for DC today. RN, patient, and patient's family notified of DC plan. Pt states will transition to home with sister in law assisting with care.  DME needs noted. Referral made with [employer] for W/C and knee scooter. Declined RW. Equipment will be delivered to bedside prior to d/c.  Family to provide transportation to home. Pt without RX med concerns. Post hospital f/u noted on AVS.   09/10/2022 @ 1255 Pt with home health PT order. Pt agreeable to home health services. No provider preference. Referral made with [facility], acceptance pending.  09/10/2022 @ 1325    Call received from [facility] and accepted pt for home health services.  RNCM will sign off for now as intervention is no longer needed. Please consult [place] again if new needs arise.   Final next level of care: Home/Self Care Barriers to Discharge: No Barriers Identified   Patient Goals and CMS Choice   Choice offered to / list presented to : Patient  Discharge Placement                         Discharge Plan and Services Additional resources added to the After Visit Summary for     Discharge Planning Services: CM Consult            DME Arranged: Other see comment (knee scooter) DME Agency: [employer] Date DME Agency Contacted: 09/10/22 Time DME Agency Contacted: 79 Representative spoke with at DME Agency: Barbara Cower            Social Determinants of Health (SDOH) Interventions SDOH Screenings   Food Insecurity: No Food Insecurity (09/09/2022)  Housing: Low Risk  (09/09/2022)   Transportation Needs: No Transportation Needs (09/09/2022)  Utilities: Not At Risk (09/09/2022)  Tobacco Use: Medium Risk (09/09/2022)     Readmission Risk Interventions     No data to display

## 2022-09-10 NOTE — Progress Notes (Signed)
    Durable Medical Equipment  (From admission, onward)           Start     Ordered   09/10/22 1050  For home use only DME lightweight manual wheelchair with seat cushion  Once       Comments: Patient suffers from left tibiotalar and distal tibiofibular arthrodesis 5/2 11/2022 which impairs their ability to perform daily activities like bathing in the home.  A walker will not resolve  issue with performing activities of daily living. A wheelchair will allow patient to safely perform daily activities. Patient is not able to propel themselves in the home using a standard weight wheelchair due to general weakness. Patient can self propel in the lightweight wheelchair. Length of need 6 months . Accessories: elevating leg rests (ELRs), wheel locks, extensions and anti-tippers.   09/10/22 1051   09/10/22 1005  For home use only DME Other see comment  Once       Comments: Knee scooter  Question:  Length of Need  Answer:  6 Months   09/10/22 1005   09/09/22 1019  DME Walker rolling  Once       Question Answer Comment  Walker: With 5 Inch Wheels   Patient needs a walker to treat with the following condition Arthritis of left ankle      09/09/22 1018

## 2022-11-14 ENCOUNTER — Other Ambulatory Visit: Payer: Self-pay | Admitting: Orthopedic Surgery

## 2022-11-14 DIAGNOSIS — M545 Low back pain, unspecified: Secondary | ICD-10-CM

## 2022-12-01 ENCOUNTER — Other Ambulatory Visit: Payer: Self-pay | Admitting: Internal Medicine

## 2022-12-01 DIAGNOSIS — Z1231 Encounter for screening mammogram for malignant neoplasm of breast: Secondary | ICD-10-CM

## 2022-12-12 ENCOUNTER — Ambulatory Visit
Admission: RE | Admit: 2022-12-12 | Discharge: 2022-12-12 | Disposition: A | Discharge status: Home / Self Care | Payer: Managed Care, Other (non HMO) | Source: Ambulatory Visit | Attending: Orthopedic Surgery | Admitting: Orthopedic Surgery

## 2022-12-12 DIAGNOSIS — M545 Low back pain, unspecified: Secondary | ICD-10-CM

## 2022-12-18 ENCOUNTER — Encounter: Payer: Self-pay | Admitting: Physical Therapy

## 2022-12-18 ENCOUNTER — Other Ambulatory Visit: Payer: Self-pay

## 2022-12-18 ENCOUNTER — Ambulatory Visit: Payer: Medicaid Other | Attending: Orthopaedic Surgery | Admitting: Physical Therapy

## 2022-12-18 DIAGNOSIS — M6281 Muscle weakness (generalized): Secondary | ICD-10-CM | POA: Insufficient documentation

## 2022-12-18 DIAGNOSIS — M25572 Pain in left ankle and joints of left foot: Secondary | ICD-10-CM | POA: Insufficient documentation

## 2022-12-18 DIAGNOSIS — R2689 Other abnormalities of gait and mobility: Secondary | ICD-10-CM | POA: Insufficient documentation

## 2022-12-18 NOTE — Therapy (Signed)
OUTPATIENT PHYSICAL THERAPY LOWER EXTREMITY EVALUATION   Patient Name: Ariana Jarvis MRN: [id number] DOB:27-Apr-1960, 62 y.o., female Today's Date: 12/18/2022  END OF SESSION:  PT End of Session - 12/18/22 1137     Visit Number 1    Date for PT Re-Evaluation 02/12/23    Authorization Type [employer] - requesting 2x/week x 8 weeks    PT Start Time 1100    PT Stop Time 1145    PT Time Calculation (min) 45 min    Behavior During Therapy WFL for tasks assessed/performed             Past Medical History:  Diagnosis Date   Anxiety    Arthritis    back   Back pain    Chronic back pain    Depression    Gastric ulcer    GERD (gastroesophageal reflux disease)    HLD (hyperlipidemia)    Hypertension    Past Surgical History:  Procedure Laterality Date   ANKLE ARTHROSCOPY WITH ARTHRODESIS Left 09/09/2022   Procedure: LEFT TIBIOTALAR JOINT ARTHRODESIS, DISTAL TIBIOFIBULAR JOINT ARTHRODESIS;  Surgeon: Terance Hart, MD;  Location: [facility];  Service: Orthopedics;  Laterality: Left;  LENGTH OF SURGERY: 150 MINUTES   CESAREAN SECTION     x2   CHOLECYSTECTOMY     COLONOSCOPY     HARDWARE REMOVAL Left 05/16/2022   Procedure: HARDWARE REMOVAL LEFT ANKLE;  Surgeon: Joen Laura, MD;  Location: [facility];  Service: Orthopedics;  Laterality: Left;   ORIF ANKLE FRACTURE Left 03/14/2022   Procedure: OPEN REDUCTION INTERNAL FIXATION (ORIF) ANKLE FRACTURE;  Surgeon: Joen Laura, MD;  Location: [facility];  Service: Orthopedics;  Laterality: Left;   ORIF RADIAL FRACTURE Left 03/14/2022   Procedure: RADIAL HEAD ARTHROPLASTY;  Surgeon: Joen Laura, MD;  Location: [facility];  Service: Orthopedics;  Laterality: Left;   SYNDESMOSIS REPAIR Left 09/09/2022   Procedure: OPEN REDUCTION OF SYNDESMOSIS;  Surgeon: Terance Hart, MD;  Location: [facility] OR;  Service: Orthopedics;  Laterality: Left;   Patient Active  Problem List   Diagnosis Date Noted   Arthritis of left ankle 09/09/2022    PCP: Quitman Livings, MD  REFERRING PROVIDER: Nicki Guadalajara, MD  REFERRING DIAG: s/p Lt ankle arthrodesis of tibiotalar and tibiofibular joints  THERAPY DIAG:  Pain in left ankle and joints of left foot  Muscle weakness (generalized)  Other abnormalities of gait and mobility  Rationale for Evaluation and Treatment: Rehabilitation  ONSET DATE: 09/09/22 surgery date, had traumatic fracture approx 3 mos before surgery  SUBJECTIVE:   SUBJECTIVE STATEMENT: Pt is just over 3 mos s/p Lt tibiotalar and tibiofibular joint arthrodesis performed by Nicki Guadalajara, MD on 09/09/22.  She suffered a Lt ankle fracture with ORIF 03/14/22 and hardware removal from this in Feb 2024.  However, she had a post-traumatic collapse and avascular necrosis, necessitating subsequent surgery. Pt arrives wearing a walking boot on Lt foot but has been cleared to WB through Lt LE as tolerated.  She has a lace up/velcro brace to wear as needed.   At home I walk on it without the brace.  I wear slippers a lot at home b/c my foot and ankle are too big to put my foot in.  My foot and ankle can swell a lot.  I have numbness along the sides.  I will elevated it throughout the day after walking a lot to help the swelling.  PERTINENT HISTORY: HTN, HLD,  back arthritis PAIN:  PAIN:  Are you having pain? Yes NPRS scale: 5-9/10 Pain location: Lt ankle Pain orientation: Left  PAIN TYPE: aching, dull, sharp, throbbing, and tight Pain description: constant  Aggravating factors: standing and walking Relieving factors: pain meds, rest/elevation, using walking boot   PRECAUTIONS: Fall  RED FLAGS: None   WEIGHT BEARING RESTRICTIONS:  cleared to WB through Lt LE as tolerated  FALLS:  Has patient fallen in last 6 months? Yes. Number of falls knee scooter tipped 1x  LIVING ENVIRONMENT: Lives with: lives with their family and lives alone Lives in:  House/apartment Stairs: No Has following equipment at home:  walking boot for Lt foot as needed, has a wheelchair if needs to walk very far  OCCUPATION: unemployed  PLOF: Independent  PATIENT GOALS: get stronger, feel more confident and less pain walking with shoe on vs boot  NEXT MD VISIT: wanted to see her in 6 weeks  OBJECTIVE:   DIAGNOSTIC FINDINGS:   PATIENT SURVEYS:  FOTO 52% goal 57%  COGNITION: Overall cognitive status: Within functional limits for tasks assessed     SENSATION: Numbness along sides of Lt ankle since surgery medial> lateral  EDEMA:  Figure 8: Lt 55cm, Rt 51cm  MUSCLE LENGTH: Lt Gastroc limited 50% WNL hips and knees bil  POSTURE: No Significant postural limitations  PALPATION: Both tender and numb along medial and lateral aspects of ankle and foot  LOWER EXTREMITY ROM: Lt ankle 0 deg DF, 41 deg PF Rt ankle 12 deg DF, 55 deg PF   LOWER EXTREMITY MMT:  Bil hips 5/5  Rt knee 5/5 Lt knee 4/5  Rt ankle 5/5  Lt ankle 3+/5 all cardinal planes of motion    FUNCTIONAL TESTS:  5 times sit to stand: 15.13 hands on thigs Timed up and go (TUG): 12.95 wearing walking boot 2 minute walk test: 27' with Lt walking boot  GAIT: Distance walked: 312' Assistive device utilized:  wearing Lt walking boot Level of assistance: Modified independence Comments: some lateral ankle weakness within boot   TODAY'S TREATMENT:                                                                                                                              DATE:  9/5:  HEP initiated Discussed edema management Work toward getting back into a laced sneaker for gait support  Check all possible CPT codes: 40981- Therapeutic Exercise    Check all conditions that are expected to impact treatment: {Conditions expected to impact treatment:None of these apply   If treatment provided at initial evaluation, no treatment charged due to lack of authorization.         PATIENT EDUCATION:  Education details: [email or web address] Person educated: Patient Education method: Explanation, Verbal cues, and Handouts Education comprehension: verbalized understanding  HOME EXERCISE PROGRAM: Access Code: [email or web address] URL: [email or web address] Date: 12/18/2022 Prepared by: Loistine Simas [place]  Exercises - Ankle Pumps in Elevation  - 2 x daily -  7 x weekly - 2 sets - 10 reps - Ankle Circles in Elevation  - 2 x daily - 7 x weekly - 2 sets - 10 reps - Supine Active Straight Leg Raise  - 2 x daily - 7 x weekly - 2 sets - 10 reps - Sit to Stand  - 2 x daily - 7 x weekly - 3 sets - 5 reps - Seated Long Arc Quad  - 2 x daily - 7 x weekly - 2 sets - 10 reps - Standing Hip Abduction with Counter Support  - 2 x daily - 7 x weekly - 2 sets - 10 reps - Standing Hip Extension with Counter Support  - 2 x daily - 7 x weekly - 2 sets - 10 reps  ASSESSMENT:  CLINICAL IMPRESSION: Patient is a 62 y.o. female who was seen today for physical therapy evaluation and treatment for Lt ankle rehab following Lt tibiotalar and tibiofibular joint arthrodesis performed 09/09/22 by Dr. Susa Simmonds. She has history of late 2023 Lt ankle fracture, ORIF, and then hardware removal in Feb 2024, followed by post-traumatic collapse and avascular necrosis necessitating fusion surgery.  Pt has been cleared to WB through Lt LE as tol.  She wears walking boot and lace up/velcro ankle brace when in community but has been wearing a hard soled slipper at home.  She presents with edema, sensation changes (numbness medial and lateral ankle), limited and painful Lt ankle ROM, and weakness surrounding Lt ankle.  She has 5/5 strength of Rt unaffected LE and 4/5 Rt knee strength.  Lt ankle strength is 3+/5 in all cardinal planes.  Pt was able to ambulate in walking boot x 2' today covering 312' for a baseline gait measurement.  PT initiated HEP and discussed edema management (elevation above heart and ice at least  2x/day) and getting back into a laced up supportive sneaker as able with swollen foot/ankle.  Pt will benefit from skilled PT to address swelling, pain, ROM and strength deficits to maximize functional daily task performance and gait for independent community accessibility.  OBJECTIVE IMPAIRMENTS: Abnormal gait, decreased activity tolerance, decreased balance, decreased coordination, decreased mobility, difficulty walking, decreased ROM, decreased strength, hypomobility, increased edema, impaired sensation, and pain.   ACTIVITY LIMITATIONS: standing, squatting, sleeping, stairs, transfers, and locomotion level  PARTICIPATION LIMITATIONS: cleaning, laundry, shopping, and community activity  PERSONAL FACTORS: Time since onset of injury/illness/exacerbation and 1 comorbidity: 3 surgeries to Lt ankle with complications following ORIF  are also affecting patient's functional outcome.   REHAB POTENTIAL: Excellent  CLINICAL DECISION MAKING: Stable/uncomplicated  EVALUATION COMPLEXITY: Low   GOALS: Goals reviewed with patient? Yes  SHORT TERM GOALS: Target date: 01/15/23 Pt will be ind with initial HEP Baseline: Goal status: INITIAL  2.  Pt will be compliant with edema management using elevation above heart and ice at least 2x/day. Baseline:  Goal status: INITIAL  3.  Pt will improve Lt ankle strength to at least 4/5 Baseline:  Goal status: INITIAL  4.  Pt will improve TUG test to 11 sec Baseline:  Goal status: INITIAL  5.  Pt will be able to perform covering 350' with sneaker and lace up brace Baseline:  Goal status: INITIAL    LONG TERM GOALS: Target date: 02/12/23  Pt will be ind with advanced HEP Baseline:  Goal status: INITIAL  2.  Pt will achieve at least 4+/5 strength throughout Lt LE for gait, transfers, curbs and functional task performance. Baseline:  Goal status: INITIAL  3.  Improve FOTO score to at least 57% to demo improved functional  performance. Baseline: 52% Goal status: INITIAL  4.  Pt will be able to participate in covering 500' in sneaker with lace up brace for improved community outings. Baseline:  Goal status: INITIAL  5.  Pt will be able to sleep through night with no or min pain interruption from Lt ankle. Baseline:  Goal status: INITIAL  6.  Pt will report improved Lt ankle pain to no greater than 6/10 with daily tasks. Baseline:  Goal status: INITIAL   PLAN:  PT FREQUENCY: 2x/week  PT DURATION: 8 weeks  PLANNED INTERVENTIONS: Therapeutic exercises, Therapeutic activity, Neuromuscular re-education, Balance training, Gait training, Patient/Family education, Self Care, Stair training, DME instructions, Aquatic Therapy, Dry Needling, Cryotherapy, Taping, Vasopneumatic device, Ionotophoresis 4mg /ml Dexamethasone, and Manual therapy  PLAN FOR NEXT SESSION: NuStep, review HEP and modify or progress as needed, has Pt gotten into a sneaker, work on Investment banker, operational with sneaker + lace up brace as able, Lt ankle strength/isometrics, Lt hip and knee strength   Jamica Woodyard, PT 12/18/22 11:37 AM

## 2022-12-30 ENCOUNTER — Ambulatory Visit: Payer: Medicaid Other | Admitting: Physical Therapy

## 2022-12-30 ENCOUNTER — Encounter: Payer: Self-pay | Admitting: Physical Therapy

## 2022-12-30 DIAGNOSIS — M6281 Muscle weakness (generalized): Secondary | ICD-10-CM

## 2022-12-30 DIAGNOSIS — M25572 Pain in left ankle and joints of left foot: Secondary | ICD-10-CM | POA: Diagnosis not present

## 2022-12-30 NOTE — Therapy (Addendum)
OUTPATIENT PHYSICAL THERAPY LOWER EXTREMITY TREATMENT   Patient Name: Ariana Jarvis MRN: [id number] DOB:05-03-1960, 62 y.o., female Today's Date: 12/30/2022  END OF SESSION:  PT End of Session - 12/30/22 1012     Visit Number 2    Date for PT Re-Evaluation 02/12/23    Authorization Type [employer] - 8 visits approved 9/5 to 11/4    Authorization Time Period 12/18/22 to 02/16/23    Authorization - Visit Number 1    Authorization - Number of Visits 8    PT Start Time 1013    PT Stop Time 1103    PT Time Calculation (min) 50 min    Activity Tolerance Patient tolerated treatment well    Behavior During Therapy WFL for tasks assessed/performed              Past Medical History:  Diagnosis Date   Anxiety    Arthritis    back   Back pain    Chronic back pain    Depression    Gastric ulcer    GERD (gastroesophageal reflux disease)    HLD (hyperlipidemia)    Hypertension    Past Surgical History:  Procedure Laterality Date   ANKLE ARTHROSCOPY WITH ARTHRODESIS Left 09/09/2022   Procedure: LEFT TIBIOTALAR JOINT ARTHRODESIS, DISTAL TIBIOFIBULAR JOINT ARTHRODESIS;  Surgeon: Terance Hart, MD;  Location: [facility];  Service: Orthopedics;  Laterality: Left;  LENGTH OF SURGERY: 150 MINUTES   CESAREAN SECTION     x2   CHOLECYSTECTOMY     COLONOSCOPY     HARDWARE REMOVAL Left 05/16/2022   Procedure: HARDWARE REMOVAL LEFT ANKLE;  Surgeon: Joen Laura, MD;  Location: [facility];  Service: Orthopedics;  Laterality: Left;   ORIF ANKLE FRACTURE Left 03/14/2022   Procedure: OPEN REDUCTION INTERNAL FIXATION (ORIF) ANKLE FRACTURE;  Surgeon: Joen Laura, MD;  Location: [facility];  Service: Orthopedics;  Laterality: Left;   ORIF RADIAL FRACTURE Left 03/14/2022   Procedure: RADIAL HEAD ARTHROPLASTY;  Surgeon: Joen Laura, MD;  Location: [facility];  Service: Orthopedics;  Laterality: Left;   SYNDESMOSIS REPAIR  Left 09/09/2022   Procedure: OPEN REDUCTION OF SYNDESMOSIS;  Surgeon: Terance Hart, MD;  Location: [facility];  Service: Orthopedics;  Laterality: Left;   Patient Active Problem List   Diagnosis Date Noted   Arthritis of left ankle 09/09/2022    PCP: Quitman Livings, MD  REFERRING PROVIDER: Nicki Guadalajara, MD  REFERRING DIAG: s/p Lt ankle arthrodesis of tibiotalar and tibiofibular joints  THERAPY DIAG:  Pain in left ankle and joints of left foot  Muscle weakness (generalized)  Rationale for Evaluation and Treatment: Rehabilitation  ONSET DATE: 09/09/22 surgery date, had traumatic fracture approx 3 mos before surgery  SUBJECTIVE:   SUBJECTIVE STATEMENT: It gave me a fit last night. Maybe because of the rain. It's getting better.   Pt is just over 3 mos s/p Lt tibiotalar and tibiofibular joint arthrodesis performed by Nicki Guadalajara, MD on 09/09/22.  She suffered a Lt ankle fracture with ORIF 03/14/22 and hardware removal from this in Feb 2024.  However, she had a post-traumatic collapse and avascular necrosis, necessitating subsequent surgery. Pt arrives wearing a walking boot on Lt foot but has been cleared to WB through Lt LE as tolerated.  She has a lace up/velcro brace to wear as needed.   At home I walk on it without the brace.  I wear slippers a lot at home b/c my foot  and ankle are too big to put my foot in.  My foot and ankle can swell a lot.  I have numbness along the sides.  I will elevated it throughout the day after walking a lot to help the swelling.  PERTINENT HISTORY: HTN, HLD, back arthritis PAIN:  PAIN:  Are you having pain? Yes NPRS scale: 5/10 Pain location: Lt ankle Pain orientation: Left  PAIN TYPE: aching, dull, sharp, throbbing, and tight Pain description: constant  Aggravating factors: standing and walking Relieving factors: pain meds, rest/elevation, using walking boot   PRECAUTIONS: Fall  RED FLAGS: None   WEIGHT BEARING RESTRICTIONS:  cleared to WB  through Lt LE as tolerated  FALLS:  Has patient fallen in last 6 months? Yes. Number of falls knee scooter tipped 1x  LIVING ENVIRONMENT: Lives with: lives with their family and lives alone Lives in: House/apartment Stairs: No Has following equipment at home:  walking boot for Lt foot as needed, has a wheelchair if needs to walk very far  OCCUPATION: unemployed  PLOF: Independent  PATIENT GOALS: get stronger, feel more confident and less pain walking with shoe on vs boot  NEXT MD VISIT: wanted to see her in 6 weeks  OBJECTIVE:   DIAGNOSTIC FINDINGS:   PATIENT SURVEYS:  FOTO 52% goal 57%  COGNITION: Overall cognitive status: Within functional limits for tasks assessed     SENSATION: Numbness along sides of Lt ankle since surgery medial> lateral  EDEMA:  Figure 8: Lt 55cm, Rt 51cm  MUSCLE LENGTH: Lt Gastroc limited 50% WNL hips and knees bil  POSTURE: No Significant postural limitations  PALPATION: Both tender and numb along medial and lateral aspects of ankle and foot  LOWER EXTREMITY ROM: Lt ankle 0 deg DF, 41 deg PF Rt ankle 12 deg DF, 55 deg PF   LOWER EXTREMITY MMT:  Bil hips 5/5  Rt knee 5/5 Lt knee 4/5  Rt ankle 5/5  Lt ankle 3+/5 all cardinal planes of motion    FUNCTIONAL TESTS:  5 times sit to stand: 15.13 hands on thigs Timed up and go (TUG): 12.95 wearing walking boot 2 minute walk test: 31' with Lt walking boot  GAIT: Distance walked: 312' Assistive device utilized:  wearing Lt walking boot Level of assistance: Modified independence Comments: some lateral ankle weakness within boot   TODAY'S TREATMENT:                                                                                                                              DATE:  12/30/22 Bike L2 x 5 min Ankle pumps and circles in supine and sitting post manual Seated PNF diagonals x 10 ea SLR x 10 L LAQ x 5 for review Manual edema resorption to L ankle x 20 min Vaso mod  pressure; 3 snowflakes x 10 min to L ankle, elevated   9/5:  HEP initiated Discussed edema management Work toward getting back into a laced sneaker for  gait support  Check all possible CPT codes: 71696- Therapeutic Exercise    Check all conditions that are expected to impact treatment: {Conditions expected to impact treatment:None of these apply   If treatment provided at initial evaluation, no treatment charged due to lack of authorization.        PATIENT EDUCATION:  Education details: [email or web address] Person educated: Patient Education method: Explanation, Verbal cues, and Handouts Education comprehension: verbalized understanding  HOME EXERCISE PROGRAM: Access Code: [email or web address] URL: [email or web address] Date: 12/18/2022 Prepared by: Loistine Simas [place]  Exercises - Ankle Pumps in Elevation  - 2 x daily - 7 x weekly - 2 sets - 10 reps - Ankle Circles in Elevation  - 2 x daily - 7 x weekly - 2 sets - 10 reps - Supine Active Straight Leg Raise  - 2 x daily - 7 x weekly - 2 sets - 10 reps - Sit to Stand  - 2 x daily - 7 x weekly - 3 sets - 5 reps - Seated Long Arc Quad  - 2 x daily - 7 x weekly - 2 sets - 10 reps - Standing Hip Abduction with Counter Support  - 2 x daily - 7 x weekly - 2 sets - 10 reps - Standing Hip Extension with Counter Support  - 2 x daily - 7 x weekly - 2 sets - 10 reps  ASSESSMENT:  CLINICAL IMPRESSION: [place] presents wearing a shoe today, untied. She still demonstrates considerable swelling so focused on manual edema resorption to lower leg, ankle and foot. Significant tissue tension in post ankle med and lateral to achilles tendon. Softer tissue in dorsum of foot, but still edematous. Tissue pressure based on patient tolerance. Vaso at end of session with mod pressure to assist with further edema control. Mild increase in pain to 6/10 at end of session. Non- WB HEP reviewed as well, but standing TE deferred for today.  OBJECTIVE IMPAIRMENTS:  Abnormal gait, decreased activity tolerance, decreased balance, decreased coordination, decreased mobility, difficulty walking, decreased ROM, decreased strength, hypomobility, increased edema, impaired sensation, and pain.   ACTIVITY LIMITATIONS: standing, squatting, sleeping, stairs, transfers, and locomotion level  PARTICIPATION LIMITATIONS: cleaning, laundry, shopping, and community activity  PERSONAL FACTORS: Time since onset of injury/illness/exacerbation and 1 comorbidity: 3 surgeries to Lt ankle with complications following ORIF  are also affecting patient's functional outcome.   REHAB POTENTIAL: Excellent  CLINICAL DECISION MAKING: Stable/uncomplicated  EVALUATION COMPLEXITY: Low   GOALS: Goals reviewed with patient? Yes  SHORT TERM GOALS: Target date: 01/15/23 Pt will be ind with initial HEP Baseline: Goal status: INITIAL  2.  Pt will be compliant with edema management using elevation above heart and ice at least 2x/day. Baseline:  Goal status: INITIAL  3.  Pt will improve Lt ankle strength to at least 4/5 Baseline:  Goal status: INITIAL  4.  Pt will improve TUG test to 11 sec Baseline:  Goal status: INITIAL  5.  Pt will be able to perform covering 350' with sneaker and lace up brace Baseline:  Goal status: INITIAL    LONG TERM GOALS: Target date: 02/12/23  Pt will be ind with advanced HEP Baseline:  Goal status: INITIAL  2.  Pt will achieve at least 4+/5 strength throughout Lt LE for gait, transfers, curbs and functional task performance. Baseline:  Goal status: INITIAL  3.  Improve FOTO score to at least 57% to demo improved functional performance. Baseline: 52% Goal status: INITIAL  4.  Pt will be able to  participate in covering 500' in sneaker with lace up brace for improved community outings. Baseline:  Goal status: INITIAL  5.  Pt will be able to sleep through night with no or min pain interruption from Lt ankle. Baseline:  Goal  status: INITIAL  6.  Pt will report improved Lt ankle pain to no greater than 6/10 with daily tasks. Baseline:  Goal status: INITIAL   PLAN:  PT FREQUENCY: 2x/week  PT DURATION: 8 weeks  PLANNED INTERVENTIONS: Therapeutic exercises, Therapeutic activity, Neuromuscular re-education, Balance training, Gait training, Patient/Family education, Self Care, Stair training, DME instructions, Aquatic Therapy, Dry Needling, Cryotherapy, Taping, Vasopneumatic device, Ionotophoresis 4mg /ml Dexamethasone, and Manual therapy  PLAN FOR NEXT SESSION: NuStep, review HEP and modify or progress as needed, has Pt gotten into a sneaker, work on Investment banker, operational with sneaker + lace up brace as able, Lt ankle strength/isometrics, Lt hip and knee strength   Solon Palm, PT  12/30/22 11:11 AM

## 2023-01-01 ENCOUNTER — Encounter: Payer: Self-pay | Admitting: Physical Therapy

## 2023-01-01 ENCOUNTER — Ambulatory Visit: Payer: Medicaid Other | Admitting: Physical Therapy

## 2023-01-01 DIAGNOSIS — M25572 Pain in left ankle and joints of left foot: Secondary | ICD-10-CM | POA: Diagnosis not present

## 2023-01-01 DIAGNOSIS — M6281 Muscle weakness (generalized): Secondary | ICD-10-CM

## 2023-01-01 DIAGNOSIS — R2689 Other abnormalities of gait and mobility: Secondary | ICD-10-CM

## 2023-01-01 NOTE — Therapy (Signed)
OUTPATIENT PHYSICAL THERAPY LOWER EXTREMITY TREATMENT   Patient Name: Katana Kisler MRN: [id number] DOB:03-Aug-1960, 62 y.o., female Today's Date: 01/01/2023  END OF SESSION:  PT End of Session - 01/01/23 1024     Visit Number 3    Date for PT Re-Evaluation 02/12/23    Authorization Type [employer] - 8 visits approved 9/5 to 11/4    Authorization Time Period 12/18/22 to 02/16/23    Authorization - Visit Number 2    Authorization - Number of Visits 8    PT Start Time 1015    PT Stop Time 1101    PT Time Calculation (min) 46 min    Activity Tolerance Patient tolerated treatment well    Behavior During Therapy WFL for tasks assessed/performed               Past Medical History:  Diagnosis Date   Anxiety    Arthritis    back   Back pain    Chronic back pain    Depression    Gastric ulcer    GERD (gastroesophageal reflux disease)    HLD (hyperlipidemia)    Hypertension    Past Surgical History:  Procedure Laterality Date   ANKLE ARTHROSCOPY WITH ARTHRODESIS Left 09/09/2022   Procedure: LEFT TIBIOTALAR JOINT ARTHRODESIS, DISTAL TIBIOFIBULAR JOINT ARTHRODESIS;  Surgeon: Terance Hart, MD;  Location: [facility];  Service: Orthopedics;  Laterality: Left;  LENGTH OF SURGERY: 150 MINUTES   CESAREAN SECTION     x2   CHOLECYSTECTOMY     COLONOSCOPY     HARDWARE REMOVAL Left 05/16/2022   Procedure: HARDWARE REMOVAL LEFT ANKLE;  Surgeon: Joen Laura, MD;  Location: [facility];  Service: Orthopedics;  Laterality: Left;   ORIF ANKLE FRACTURE Left 03/14/2022   Procedure: OPEN REDUCTION INTERNAL FIXATION (ORIF) ANKLE FRACTURE;  Surgeon: Joen Laura, MD;  Location: [facility];  Service: Orthopedics;  Laterality: Left;   ORIF RADIAL FRACTURE Left 03/14/2022   Procedure: RADIAL HEAD ARTHROPLASTY;  Surgeon: Joen Laura, MD;  Location: [facility];  Service: Orthopedics;  Laterality: Left;   SYNDESMOSIS  REPAIR Left 09/09/2022   Procedure: OPEN REDUCTION OF SYNDESMOSIS;  Surgeon: Terance Hart, MD;  Location: [facility] OR;  Service: Orthopedics;  Laterality: Left;   Patient Active Problem List   Diagnosis Date Noted   Arthritis of left ankle 09/09/2022    PCP: Quitman Livings, MD  REFERRING PROVIDER: Nicki Guadalajara, MD  REFERRING DIAG: s/p Lt ankle arthrodesis of tibiotalar and tibiofibular joints  THERAPY DIAG:  Pain in left ankle and joints of left foot  Muscle weakness (generalized)  Other abnormalities of gait and mobility  Rationale for Evaluation and Treatment: Rehabilitation  ONSET DATE: 09/09/22 surgery date, had traumatic fracture approx 3 mos before surgery  SUBJECTIVE:   SUBJECTIVE STATEMENT: Patient reported she was very sore and swollen after last session.   Pt is just over 3 mos s/p Lt tibiotalar and tibiofibular joint arthrodesis performed by Nicki Guadalajara, MD on 09/09/22.  She suffered a Lt ankle fracture with ORIF 03/14/22 and hardware removal from this in Feb 2024.  However, she had a post-traumatic collapse and avascular necrosis, necessitating subsequent surgery. Pt arrives wearing a walking boot on Lt foot but has been cleared to WB through Lt LE as tolerated.  She has a lace up/velcro brace to wear as needed.   At home I walk on it without the brace.  I wear slippers a lot at  home b/c my foot and ankle are too big to put my foot in.  My foot and ankle can swell a lot.  I have numbness along the sides.  I will elevated it throughout the day after walking a lot to help the swelling.  PERTINENT HISTORY: HTN, HLD, back arthritis PAIN:  PAIN:  Are you having pain? Yes NPRS scale: 5/10 Pain location: Lt ankle Pain orientation: Left  PAIN TYPE: aching, dull, sharp, throbbing, and tight Pain description: constant  Aggravating factors: standing and walking Relieving factors: pain meds, rest/elevation, using walking boot   PRECAUTIONS: Fall  RED  FLAGS: None   WEIGHT BEARING RESTRICTIONS:  cleared to WB through Lt LE as tolerated  FALLS:  Has patient fallen in last 6 months? Yes. Number of falls knee scooter tipped 1x  LIVING ENVIRONMENT: Lives with: lives with their family and lives alone Lives in: House/apartment Stairs: No Has following equipment at home:  walking boot for Lt foot as needed, has a wheelchair if needs to walk very far  OCCUPATION: unemployed  PLOF: Independent  PATIENT GOALS: get stronger, feel more confident and less pain walking with shoe on vs boot  NEXT MD VISIT: wanted to see her in 6 weeks  OBJECTIVE:   DIAGNOSTIC FINDINGS:   PATIENT SURVEYS:  FOTO 52% goal 57%  COGNITION: Overall cognitive status: Within functional limits for tasks assessed     SENSATION: Numbness along sides of Lt ankle since surgery medial> lateral  EDEMA:  Figure 8: Lt 55cm, Rt 51cm  MUSCLE LENGTH: Lt Gastroc limited 50% WNL hips and knees bil  POSTURE: No Significant postural limitations  PALPATION: Both tender and numb along medial and lateral aspects of ankle and foot  LOWER EXTREMITY ROM: Lt ankle 0 deg DF, 41 deg PF Rt ankle 12 deg DF, 55 deg PF   LOWER EXTREMITY MMT:  Bil hips 5/5  Rt knee 5/5 Lt knee 4/5  Rt ankle 5/5  Lt ankle 3+/5 all cardinal planes of motion    FUNCTIONAL TESTS:  5 times sit to stand: 15.13 hands on thigs Timed up and go (TUG): 12.95 wearing walking boot 2 minute walk test: 76' with Lt walking boot  GAIT: Distance walked: 312' Assistive device utilized:  wearing Lt walking boot Level of assistance: Modified independence Comments: some lateral ankle weakness within boot   TODAY'S TREATMENT:                                                                                                                              DATE:   01/01/23 Bike L2 x 6 min Standing hip IR for stretch 3x30 sec - standing turning left foot to neutral Standing hip ABD and ext x 20 ea  B Weight shifting side to side, semi tandem stance and step through on L LE Seated arch lifts seated and standing x 10. Seated Ankle pumps and circles Self care: discussed compression socks to help with edema.  Manual retrograde massage to left ankle with IASTM using metal Graston tool.    12/30/22 Bike L2 x 5 min Ankle pumps and circles in supine and sitting post manual Seated PNF diagonals x 10 ea SLR x 10 L LAQ x 5 for review Manual edema resorption to L ankle x 20 min Vaso mod pressure; 3 snowflakes x 10 min to L ankle, elevated   9/5:  HEP initiated Discussed edema management Work toward getting back into a laced sneaker for gait support  Check all possible CPT codes: 56213- Therapeutic Exercise    Check all conditions that are expected to impact treatment: {Conditions expected to impact treatment:None of these apply   If treatment provided at initial evaluation, no treatment charged due to lack of authorization.        PATIENT EDUCATION:  Education details: [email or web address] Person educated: Patient Education method: Explanation, Verbal cues, and Handouts Education comprehension: verbalized understanding  HOME EXERCISE PROGRAM: Access Code: [email or web address] URL: [email or web address] Date: 12/18/2022 Prepared by: Loistine Simas Beuhring  Exercises - Ankle Pumps in Elevation  - 2 x daily - 7 x weekly - 2 sets - 10 reps - Ankle Circles in Elevation  - 2 x daily - 7 x weekly - 2 sets - 10 reps - Supine Active Straight Leg Raise  - 2 x daily - 7 x weekly - 2 sets - 10 reps - Sit to Stand  - 2 x daily - 7 x weekly - 3 sets - 5 reps - Seated Long Arc Quad  - 2 x daily - 7 x weekly - 2 sets - 10 reps - Standing Hip Abduction with Counter Support  - 2 x daily - 7 x weekly - 2 sets - 10 reps - Standing Hip Extension with Counter Support  - 2 x daily - 7 x weekly - 2 sets - 10 reps  ASSESSMENT:  CLINICAL IMPRESSION: Patient presents in CAM boot today because the soft AFO "is a  pain to get on/off". She did not bring a shoe for the left foot, so asked her to bring shoe with her next visit. She tolerated standing TE without complaint today except for attempted gastroc stretch which she feels more across the ant ankle than in calf. We worked on weight shifting focusing on keeping the left foot in correct alignment. Less aggressive with manual today due to increased pain after initial treatment visit. Encouraged patient to get compression socks to help with edema.   OBJECTIVE IMPAIRMENTS: Abnormal gait, decreased activity tolerance, decreased balance, decreased coordination, decreased mobility, difficulty walking, decreased ROM, decreased strength, hypomobility, increased edema, impaired sensation, and pain.   ACTIVITY LIMITATIONS: standing, squatting, sleeping, stairs, transfers, and locomotion level  PARTICIPATION LIMITATIONS: cleaning, laundry, shopping, and community activity  PERSONAL FACTORS: Time since onset of injury/illness/exacerbation and 1 comorbidity: 3 surgeries to Lt ankle with complications following ORIF  are also affecting patient's functional outcome.   REHAB POTENTIAL: Excellent  CLINICAL DECISION MAKING: Stable/uncomplicated  EVALUATION COMPLEXITY: Low   GOALS: Goals reviewed with patient? Yes  SHORT TERM GOALS: Target date: 01/15/23 Pt will be ind with initial HEP Baseline: Goal status: INITIAL  2.  Pt will be compliant with edema management using elevation above heart and ice at least 2x/day. Baseline:  Goal status: INITIAL  3.  Pt will improve Lt ankle strength to at least 4/5 Baseline:  Goal status: INITIAL  4.  Pt will improve TUG test to 11 sec Baseline:  Goal status: INITIAL  5.  Pt  will be able to perform covering 350' with sneaker and lace up brace Baseline:  Goal status: INITIAL    LONG TERM GOALS: Target date: 02/12/23  Pt will be ind with advanced HEP Baseline:  Goal status: INITIAL  2.  Pt will achieve at  least 4+/5 strength throughout Lt LE for gait, transfers, curbs and functional task performance. Baseline:  Goal status: INITIAL  3.  Improve FOTO score to at least 57% to demo improved functional performance. Baseline: 52% Goal status: INITIAL  4.  Pt will be able to participate in covering 500' in sneaker with lace up brace for improved community outings. Baseline:  Goal status: INITIAL  5.  Pt will be able to sleep through night with no or min pain interruption from Lt ankle. Baseline:  Goal status: INITIAL  6.  Pt will report improved Lt ankle pain to no greater than 6/10 with daily tasks. Baseline:  Goal status: INITIAL   PLAN:  PT FREQUENCY: 2x/week  PT DURATION: 8 weeks  PLANNED INTERVENTIONS: Therapeutic exercises, Therapeutic activity, Neuromuscular re-education, Balance training, Gait training, Patient/Family education, Self Care, Stair training, DME instructions, Aquatic Therapy, Dry Needling, Cryotherapy, Taping, Vasopneumatic device, Ionotophoresis 4mg /ml Dexamethasone, and Manual therapy  PLAN FOR NEXT SESSION: NuStep, review HEP and modify or progress as needed, has Pt gotten into a sneaker, work on Investment banker, operational with sneaker + lace up brace as able, Lt ankle strength/isometrics, Lt hip and knee strength   Solon Palm, PT  01/01/23 12:19 PM

## 2023-01-05 NOTE — Therapy (Signed)
OUTPATIENT PHYSICAL THERAPY LOWER EXTREMITY TREATMENT   Patient Name: Ariana Jarvis MRN: [id number] DOB:Jun 16, 1960, 62 y.o., female Today's Date: 01/06/2023  END OF SESSION:  PT End of Session - 01/06/23 1024     Visit Number 4    Date for PT Re-Evaluation 02/12/23    Authorization Type [employer] - 8 visits approved 9/5 to 11/4    Authorization Time Period 12/18/22 to 02/16/23    Authorization - Visit Number 3    Authorization - Number of Visits 8    PT Start Time 1021    PT Stop Time 1104    PT Time Calculation (min) 43 min    Activity Tolerance Patient tolerated treatment well    Behavior During Therapy WFL for tasks assessed/performed                Past Medical History:  Diagnosis Date   Anxiety    Arthritis    back   Back pain    Chronic back pain    Depression    Gastric ulcer    GERD (gastroesophageal reflux disease)    HLD (hyperlipidemia)    Hypertension    Past Surgical History:  Procedure Laterality Date   ANKLE ARTHROSCOPY WITH ARTHRODESIS Left 09/09/2022   Procedure: LEFT TIBIOTALAR JOINT ARTHRODESIS, DISTAL TIBIOFIBULAR JOINT ARTHRODESIS;  Surgeon: Terance Hart, MD;  Location: [facility];  Service: Orthopedics;  Laterality: Left;  LENGTH OF SURGERY: 150 MINUTES   CESAREAN SECTION     x2   CHOLECYSTECTOMY     COLONOSCOPY     HARDWARE REMOVAL Left 05/16/2022   Procedure: HARDWARE REMOVAL LEFT ANKLE;  Surgeon: Joen Laura, MD;  Location: [facility];  Service: Orthopedics;  Laterality: Left;   ORIF ANKLE FRACTURE Left 03/14/2022   Procedure: OPEN REDUCTION INTERNAL FIXATION (ORIF) ANKLE FRACTURE;  Surgeon: Joen Laura, MD;  Location: [facility];  Service: Orthopedics;  Laterality: Left;   ORIF RADIAL FRACTURE Left 03/14/2022   Procedure: RADIAL HEAD ARTHROPLASTY;  Surgeon: Joen Laura, MD;  Location: [facility];  Service: Orthopedics;  Laterality: Left;   SYNDESMOSIS  REPAIR Left 09/09/2022   Procedure: OPEN REDUCTION OF SYNDESMOSIS;  Surgeon: Terance Hart, MD;  Location: [facility];  Service: Orthopedics;  Laterality: Left;   Patient Active Problem List   Diagnosis Date Noted   Arthritis of left ankle 09/09/2022    PCP: Quitman Livings, MD  REFERRING PROVIDER: Nicki Guadalajara, MD  REFERRING DIAG: s/p Lt ankle arthrodesis of tibiotalar and tibiofibular joints  THERAPY DIAG:  Pain in left ankle and joints of left foot  Muscle weakness (generalized)  Other abnormalities of gait and mobility  Rationale for Evaluation and Treatment: Rehabilitation  ONSET DATE: 09/09/22 surgery date, had traumatic fracture approx 3 mos before surgery  SUBJECTIVE:   SUBJECTIVE STATEMENT: It seems like it's getting better each time I come.   Pt is just over 3 mos s/p Lt tibiotalar and tibiofibular joint arthrodesis performed by Nicki Guadalajara, MD on 09/09/22.  She suffered a Lt ankle fracture with ORIF 03/14/22 and hardware removal from this in Feb 2024.  However, she had a post-traumatic collapse and avascular necrosis, necessitating subsequent surgery. Pt arrives wearing a walking boot on Lt foot but has been cleared to WB through Lt LE as tolerated.  She has a lace up/velcro brace to wear as needed.   At home I walk on it without the brace.  I wear slippers a lot at  home b/c my foot and ankle are too big to put my foot in.  My foot and ankle can swell a lot.  I have numbness along the sides.  I will elevated it throughout the day after walking a lot to help the swelling.  PERTINENT HISTORY: HTN, HLD, back arthritis PAIN:  PAIN:  Are you having pain? Yes NPRS scale: 5/10 Pain location: Lt ankle Pain orientation: Left  PAIN TYPE: aching, dull, sharp, throbbing, and tight Pain description: constant  Aggravating factors: standing and walking Relieving factors: pain meds, rest/elevation, using walking boot   PRECAUTIONS: Fall  RED FLAGS: None   WEIGHT BEARING  RESTRICTIONS:  cleared to WB through Lt LE as tolerated  FALLS:  Has patient fallen in last 6 months? Yes. Number of falls knee scooter tipped 1x  LIVING ENVIRONMENT: Lives with: lives with their family and lives alone Lives in: House/apartment Stairs: No Has following equipment at home:  walking boot for Lt foot as needed, has a wheelchair if needs to walk very far  OCCUPATION: unemployed  PLOF: Independent  PATIENT GOALS: get stronger, feel more confident and less pain walking with shoe on vs boot  NEXT MD VISIT: wanted to see her in 6 weeks  OBJECTIVE:   DIAGNOSTIC FINDINGS:   PATIENT SURVEYS:  FOTO 52% goal 57%  COGNITION: Overall cognitive status: Within functional limits for tasks assessed     SENSATION: Numbness along sides of Lt ankle since surgery medial> lateral  EDEMA:  Figure 8: Lt 55cm, Rt 51cm  MUSCLE LENGTH: Lt Gastroc limited 50% WNL hips and knees bil  POSTURE: No Significant postural limitations  PALPATION: Both tender and numb along medial and lateral aspects of ankle and foot  LOWER EXTREMITY ROM: Lt ankle 0 deg DF, 41 deg PF Rt ankle 12 deg DF, 55 deg PF   LOWER EXTREMITY MMT:  Bil hips 5/5  Rt knee 5/5 Lt knee 4/5  Rt ankle 5/5  Lt ankle 3+/5 all cardinal planes of motion    FUNCTIONAL TESTS:  5 times sit to stand: 15.13 hands on thigs Timed up and go (TUG): 12.95 wearing walking boot 2 minute walk test: 75' with Lt walking boot  GAIT: Distance walked: 312' Assistive device utilized:  wearing Lt walking boot Level of assistance: Modified independence Comments: some lateral ankle weakness within boot   TODAY'S TREATMENT:                                                                                                                              DATE:   01/06/23 Bike L2 x 5 min Rockerboard x 2 min Heel raises x 10 Baps L 1 PF/DF/EVER/INV, circles CW/CCW x 10 ea Standing hip ABD x 20 ea B 4 way ankle Tband: red x 10 ea,  then green for inv/ever x 10 ea  01/01/23 Bike L2 x 6 min Standing hip IR for stretch 3x30 sec - standing turning left foot to  neutral Standing hip ABD and ext x 20 ea B Weight shifting side to side, semi tandem stance and step through on L LE Seated arch lifts seated and standing x 10. Seated Ankle pumps and circles Self care: discussed compression socks to help with edema. Manual retrograde massage to left ankle with IASTM using metal Graston tool.    12/30/22 Bike L2 x 5 min Ankle pumps and circles in supine and sitting post manual Seated PNF diagonals x 10 ea SLR x 10 L LAQ x 5 for review Manual edema resorption to L ankle x 20 min Vaso mod pressure; 3 snowflakes x 10 min to L ankle, elevated   9/5:  HEP initiated Discussed edema management Work toward getting back into a laced sneaker for gait support  Check all possible CPT codes: 44010- Therapeutic Exercise    Check all conditions that are expected to impact treatment: {Conditions expected to impact treatment:None of these apply   If treatment provided at initial evaluation, no treatment charged due to lack of authorization.        PATIENT EDUCATION:  Education details: [email or web address] Person educated: Patient Education method: Explanation, Verbal cues, and Handouts Education comprehension: verbalized understanding  HOME EXERCISE PROGRAM: Access Code: [email or web address] URL: [email or web address] Date: 01/06/2023 Prepared by: Raynelle Fanning  Exercises - Ankle Pumps in Elevation  - 2 x daily - 7 x weekly - 2 sets - 10 reps - Ankle Circles in Elevation  - 2 x daily - 7 x weekly - 2 sets - 10 reps - Supine Active Straight Leg Raise  - 2 x daily - 7 x weekly - 2 sets - 10 reps - Sit to Stand  - 2 x daily - 7 x weekly - 3 sets - 5 reps - Seated Long Arc Quad  - 2 x daily - 7 x weekly - 2 sets - 10 reps - Standing Hip Abduction with Counter Support  - 2 x daily - 7 x weekly - 2 sets - 10 reps - Standing Hip Extension with  Counter Support  - 2 x daily - 7 x weekly - 2 sets - 10 reps - Seated Ankle Eversion with Resistance  - 1 x daily - 3-4 x weekly - 1 sets - 10 reps - Seated Ankle Inversion with Resistance  - 1 x daily - 3-4 x weekly - 1 sets - 10 reps - Heel Raises with Counter Support  - 1 x daily - 3-4 x weekly - 1 sets - 10 reps  ASSESSMENT:  CLINICAL IMPRESSION: Patient presents today in soft AFO and Birkenstock-type sandals because they are more comfortable for her than the tennis shoe. She has not attained compression socks yet, but plans to get some soon. Pt was also encouraged to elevate and ice to help with edema which she has not been doing. She tolerated new TE today with only mild reports of pain. She is working on correcting her gait pattern. HEP was progressed with strengthening. She continues to demonstrate potential for improvement and would benefit from continued skilled therapy to address impairments.    OBJECTIVE IMPAIRMENTS: Abnormal gait, decreased activity tolerance, decreased balance, decreased coordination, decreased mobility, difficulty walking, decreased ROM, decreased strength, hypomobility, increased edema, impaired sensation, and pain.   ACTIVITY LIMITATIONS: standing, squatting, sleeping, stairs, transfers, and locomotion level  PARTICIPATION LIMITATIONS: cleaning, laundry, shopping, and community activity  PERSONAL FACTORS: Time since onset of injury/illness/exacerbation and 1 comorbidity: 3 surgeries to Lt ankle with complications following ORIF  are also affecting  patient's functional outcome.   REHAB POTENTIAL: Excellent  CLINICAL DECISION MAKING: Stable/uncomplicated  EVALUATION COMPLEXITY: Low   GOALS: Goals reviewed with patient? Yes  SHORT TERM GOALS: Target date: 01/15/23 Pt will be ind with initial HEP Baseline: Goal status: INITIAL  2.  Pt will be compliant with edema management using elevation above heart and ice at least 2x/day. Baseline:  Goal status: IN  PROGRESS   3.  Pt will improve Lt ankle strength to at least 4/5 Baseline:  Goal status: INITIAL  4.  Pt will improve TUG test to 11 sec Baseline:  Goal status: INITIAL  5.  Pt will be able to perform covering 350' with sneaker and lace up brace Baseline:  Goal status: INITIAL    LONG TERM GOALS: Target date: 02/12/23  Pt will be ind with advanced HEP Baseline:  Goal status: INITIAL  2.  Pt will achieve at least 4+/5 strength throughout Lt LE for gait, transfers, curbs and functional task performance. Baseline:  Goal status: INITIAL  3.  Improve FOTO score to at least 57% to demo improved functional performance. Baseline: 52% Goal status: INITIAL  4.  Pt will be able to participate in covering 500' in sneaker with lace up brace for improved community outings. Baseline:  Goal status: INITIAL  5.  Pt will be able to sleep through night with no or min pain interruption from Lt ankle. Baseline:  Goal status: INITIAL  6.  Pt will report improved Lt ankle pain to no greater than 6/10 with daily tasks. Baseline:  Goal status: INITIAL   PLAN:  PT FREQUENCY: 2x/week  PT DURATION: 8 weeks  PLANNED INTERVENTIONS: Therapeutic exercises, Therapeutic activity, Neuromuscular re-education, Balance training, Gait training, Patient/Family education, Self Care, Stair training, DME instructions, Aquatic Therapy, Dry Needling, Cryotherapy, Taping, Vasopneumatic device, Ionotophoresis 4mg /ml Dexamethasone, and Manual therapy  PLAN FOR NEXT SESSION: modify and progress HEP as needed, has Pt gotten into a sneaker, work on Investment banker, operational with sneaker + lace up brace as able, Lt ankle strength/isometrics, Lt hip and knee strength   Solon Palm, PT  01/06/23 8:20 PM

## 2023-01-06 ENCOUNTER — Ambulatory Visit: Payer: Medicaid Other | Admitting: Physical Therapy

## 2023-01-06 ENCOUNTER — Encounter: Payer: Self-pay | Admitting: Physical Therapy

## 2023-01-06 DIAGNOSIS — M25572 Pain in left ankle and joints of left foot: Secondary | ICD-10-CM

## 2023-01-06 DIAGNOSIS — R2689 Other abnormalities of gait and mobility: Secondary | ICD-10-CM

## 2023-01-06 DIAGNOSIS — M6281 Muscle weakness (generalized): Secondary | ICD-10-CM

## 2023-01-08 ENCOUNTER — Ambulatory Visit: Payer: Medicaid Other | Admitting: Physical Therapy

## 2023-01-08 DIAGNOSIS — M6281 Muscle weakness (generalized): Secondary | ICD-10-CM

## 2023-01-08 DIAGNOSIS — M25572 Pain in left ankle and joints of left foot: Secondary | ICD-10-CM

## 2023-01-08 NOTE — Therapy (Signed)
OUTPATIENT PHYSICAL THERAPY LOWER EXTREMITY TREATMENT   Patient Name: Ariana Jarvis MRN: [id number] DOB:[birth year], 62 y.o., female Today's Date: 01/08/2023  END OF SESSION:  PT End of Session - 01/08/23 1114     Visit Number 5    Date for PT Re-Evaluation 02/12/23    Authorization Type [employer] - 8 visits approved 9/5 to 11/4    Authorization Time Period 12/18/22 to 02/16/23    Authorization - Visit Number 4    Authorization - Number of Visits 8    PT Start Time 1114    PT Stop Time 1146    PT Time Calculation (min) 32 min    Activity Tolerance Patient tolerated treatment well    Behavior During Therapy WFL for tasks assessed/performed                 Past Medical History:  Diagnosis Date   Anxiety    Arthritis    back   Back pain    Chronic back pain    Depression    Gastric ulcer    GERD (gastroesophageal reflux disease)    HLD (hyperlipidemia)    Hypertension    Past Surgical History:  Procedure Laterality Date   ANKLE ARTHROSCOPY WITH ARTHRODESIS Left 09/09/2022   Procedure: LEFT TIBIOTALAR JOINT ARTHRODESIS, DISTAL TIBIOFIBULAR JOINT ARTHRODESIS;  Surgeon: Terance Hart, MD;  Location: [facility] OR;  Service: Orthopedics;  Laterality: Left;  LENGTH OF SURGERY: 150 MINUTES   CESAREAN SECTION     x2   CHOLECYSTECTOMY     COLONOSCOPY     HARDWARE REMOVAL Left 05/16/2022   Procedure: HARDWARE REMOVAL LEFT ANKLE;  Surgeon: Joen Laura, MD;  Location: [facility];  Service: Orthopedics;  Laterality: Left;   ORIF ANKLE FRACTURE Left 03/14/2022   Procedure: OPEN REDUCTION INTERNAL FIXATION (ORIF) ANKLE FRACTURE;  Surgeon: Joen Laura, MD;  Location: [facility];  Service: Orthopedics;  Laterality: Left;   ORIF RADIAL FRACTURE Left 03/14/2022   Procedure: RADIAL HEAD ARTHROPLASTY;  Surgeon: Joen Laura, MD;  Location: [facility];  Service: Orthopedics;  Laterality: Left;   SYNDESMOSIS  REPAIR Left 09/09/2022   Procedure: OPEN REDUCTION OF SYNDESMOSIS;  Surgeon: Terance Hart, MD;  Location: [facility] OR;  Service: Orthopedics;  Laterality: Left;   Patient Active Problem List   Diagnosis Date Noted   Arthritis of left ankle 09/09/2022    PCP: Quitman Livings, MD  REFERRING PROVIDER: Nicki Guadalajara, MD  REFERRING DIAG: s/p Lt ankle arthrodesis of tibiotalar and tibiofibular joints  THERAPY DIAG:  Pain in left ankle and joints of left foot  Muscle weakness (generalized)  Rationale for Evaluation and Treatment: Rehabilitation  ONSET DATE: 09/09/22 surgery date, had traumatic fracture approx 3 mos before surgery  SUBJECTIVE:   SUBJECTIVE STATEMENT: My knees are hurting worse than my ankle with this weather. Pt states her tennis shoes fall off because she bought them big. Her regular tennis shoes are still too tight with the brace.   Pt is just over 3 mos s/p Lt tibiotalar and tibiofibular joint arthrodesis performed by Nicki Guadalajara, MD on 09/09/22.  She suffered a Lt ankle fracture with ORIF 03/14/22 and hardware removal from this in Feb 2024.  However, she had a post-traumatic collapse and avascular necrosis, necessitating subsequent surgery. Pt arrives wearing a walking boot on Lt foot but has been cleared to WB through Lt LE as tolerated.  She has a lace up/velcro brace to wear as needed.  At home I walk on it without the brace.  I wear slippers a lot at home b/c my foot and ankle are too big to put my foot in.  My foot and ankle can swell a lot.  I have numbness along the sides.  I will elevated it throughout the day after walking a lot to help the swelling.  PERTINENT HISTORY: HTN, HLD, back arthritis PAIN:  PAIN:  Are you having pain? Yes NPRS scale: 5/10 Pain location: Lt lateral ankle Pain orientation: Left  PAIN TYPE: aching, dull, sharp, throbbing, and tight Pain description: constant  Aggravating factors: standing and walking Relieving factors: pain meds,  rest/elevation, using walking boot   PRECAUTIONS: Fall  RED FLAGS: None   WEIGHT BEARING RESTRICTIONS:  cleared to WB through Lt LE as tolerated  FALLS:  Has patient fallen in last 6 months? Yes. Number of falls knee scooter tipped 1x  LIVING ENVIRONMENT: Lives with: lives with their family and lives alone Lives in: House/apartment Stairs: No Has following equipment at home:  walking boot for Lt foot as needed, has a wheelchair if needs to walk very far  OCCUPATION: unemployed  PLOF: Independent  PATIENT GOALS: get stronger, feel more confident and less pain walking with shoe on vs boot  NEXT MD VISIT: wanted to see her in 6 weeks  OBJECTIVE:   DIAGNOSTIC FINDINGS:   PATIENT SURVEYS:  FOTO 52% goal 57%  COGNITION: Overall cognitive status: Within functional limits for tasks assessed     SENSATION: Numbness along sides of Lt ankle since surgery medial> lateral  EDEMA:  Figure 8: Lt 55cm, Rt 51cm  MUSCLE LENGTH: Lt Gastroc limited 50% WNL hips and knees bil  POSTURE: No Significant postural limitations  PALPATION: Both tender and numb along medial and lateral aspects of ankle and foot  LOWER EXTREMITY ROM: Lt ankle 0 deg DF, 41 deg PF Rt ankle 12 deg DF, 55 deg PF   LOWER EXTREMITY MMT:  Bil hips 5/5  Rt knee 5/5 Lt knee 4/5  Rt ankle 5/5  Lt ankle 3+/5 all cardinal planes of motion    FUNCTIONAL TESTS:  5 times sit to stand: 15.13 hands on thighs Timed up and go (TUG): 12.95 wearing walking boot 2 minute walk test: 35' with Lt walking boot  GAIT: Distance walked: 312' Assistive device utilized:  wearing Lt walking boot Level of assistance: Modified independence Comments: some lateral ankle weakness within boot   TODAY'S TREATMENT:                                                                                                                              DATE:  01/08/23: Bike L3 x 5 min Rockerboard x 1 min increases pain in heel Gait:  focusing on foot position, step and stride length Heel raises x 10 seated Standing hip ABD and EXT x 20 ea B yellow loop Manual: STM to L plantar fascia x 5 min  01/06/23  Bike L2 x 5 min Rockerboard x 2 min  Heel raises x 10 Baps L 1 PF/DF/EVER/INV, circles CW/CCW x 10 ea Standing hip ABD x 20  4 way ankle Tband: red x 10 ea, then green for inv/ever x 10 ea  01/01/23 Bike L2 x 6 min Standing hip IR for stretch 3x30 sec - standing turning left foot to neutral Standing hip ABD and ext x 20 ea B Weight shifting side to side, semi tandem stance and step through on L LE Seated arch lifts seated and standing x 10. Seated Ankle pumps and circles Self care: discussed compression socks to help with edema. Manual retrograde massage to left ankle with IASTM using metal Graston tool.    12/30/22 Bike L2 x 5 min Ankle pumps and circles in supine and sitting post manual Seated PNF diagonals x 10 ea SLR x 10 L LAQ x 5 for review Manual edema resorption to L ankle x 20 min Vaso mod pressure; 3 snowflakes x 10 min to L ankle, elevated    PATIENT EDUCATION:  Education details: [email or web address] Person educated: Patient Education method: Explanation, Verbal cues, and Handouts Education comprehension: verbalized understanding  HOME EXERCISE PROGRAM: Access Code: [email or web address] URL: [email or web address] Date: 01/06/2023 Prepared by: Raynelle Fanning  Exercises - Ankle Pumps in Elevation  - 2 x daily - 7 x weekly - 2 sets - 10 reps - Ankle Circles in Elevation  - 2 x daily - 7 x weekly - 2 sets - 10 reps - Supine Active Straight Leg Raise  - 2 x daily - 7 x weekly - 2 sets - 10 reps - Sit to Stand  - 2 x daily - 7 x weekly - 3 sets - 5 reps - Seated Long Arc Quad  - 2 x daily - 7 x weekly - 2 sets - 10 reps - Standing Hip Abduction with Counter Support  - 2 x daily - 7 x weekly - 2 sets - 10 reps - Standing Hip Extension with Counter Support  - 2 x daily - 7 x weekly - 2 sets - 10 reps - Seated  Ankle Eversion with Resistance  - 1 x daily - 3-4 x weekly - 1 sets - 10 reps - Seated Ankle Inversion with Resistance  - 1 x daily - 3-4 x weekly - 1 sets - 10 reps - Heel Raises with Counter Support  - 1 x daily - 3-4 x weekly - 1 sets - 10 reps  ASSESSMENT:  CLINICAL IMPRESSION: Patient arrived 14 min late to therapy and also took a few minute phone call during session so treatment limited today. We worked on her gait pattern today  focusing on correct foot position, step length and stride with verbal cues and visual cues in the mirror. She reported some pain in her heel with heel raises today so briefly performed manual therapy with excellent response. Advised tennis ball to plantar fascia. Tband issued for hip strengthening. Patient has not been compliant with ice. Advised ice before bed as she still has the most pain at night.  OBJECTIVE IMPAIRMENTS: Abnormal gait, decreased activity tolerance, decreased balance, decreased coordination, decreased mobility, difficulty walking, decreased ROM, decreased strength, hypomobility, increased edema, impaired sensation, and pain.   ACTIVITY LIMITATIONS: standing, squatting, sleeping, stairs, transfers, and locomotion level  PARTICIPATION LIMITATIONS: cleaning, laundry, shopping, and community activity  PERSONAL FACTORS: Time since onset of injury/illness/exacerbation and 1 comorbidity: 3 surgeries to Lt ankle with complications following ORIF  are also affecting  patient's functional outcome.   REHAB POTENTIAL: Excellent  CLINICAL DECISION MAKING: Stable/uncomplicated  EVALUATION COMPLEXITY: Low   GOALS: Goals reviewed with patient? Yes  SHORT TERM GOALS: Target date: 01/15/23 Pt will be ind with initial HEP Baseline: Goal status: INITIAL  2.  Pt will be compliant with edema management using elevation above heart and ice at least 2x/day. Baseline:  Goal status: IN PROGRESS   3.  Pt will improve Lt ankle strength to at least  4/5 Baseline:  Goal status: INITIAL  4.  Pt will improve TUG test to 11 sec Baseline:  Goal status: INITIAL  5.  Pt will be able to perform covering 350' with sneaker and lace up brace Baseline:  Goal status: INITIAL    LONG TERM GOALS: Target date: 02/12/23  Pt will be ind with advanced HEP Baseline:  Goal status: INITIAL  2.  Pt will achieve at least 4+/5 strength throughout Lt LE for gait, transfers, curbs and functional task performance. Baseline:  Goal status: INITIAL  3.  Improve FOTO score to at least 57% to demo improved functional performance. Baseline: 52% Goal status: INITIAL  4.  Pt will be able to participate in covering 500' in sneaker with lace up brace for improved community outings. Baseline:  Goal status: INITIAL  5.  Pt will be able to sleep through night with no or min pain interruption from Lt ankle. Baseline:  Goal status: INITIAL  6.  Pt will report improved Lt ankle pain to no greater than 6/10 with daily tasks. Baseline:  Goal status: INITIAL   PLAN:  PT FREQUENCY: 2x/week  PT DURATION: 8 weeks  PLANNED INTERVENTIONS: Therapeutic exercises, Therapeutic activity, Neuromuscular re-education, Balance training, Gait training, Patient/Family education, Self Care, Stair training, DME instructions, Aquatic Therapy, Dry Needling, Cryotherapy, Taping, Vasopneumatic device, Ionotophoresis 4mg /ml Dexamethasone, and Manual therapy  PLAN FOR NEXT SESSION: modify and progress HEP as needed, has Pt gotten into a sneaker, work on Investment banker, operational with sneaker + lace up brace as able, Lt ankle strength/isometrics, Lt hip and knee strength   Solon Palm, PT  01/08/23 4:43 PM

## 2023-01-13 ENCOUNTER — Ambulatory Visit: Payer: Medicaid Other | Admitting: Physical Therapy

## 2023-01-14 ENCOUNTER — Ambulatory Visit: Payer: Medicaid Other | Attending: Orthopaedic Surgery | Admitting: Physical Therapy

## 2023-01-14 DIAGNOSIS — R2689 Other abnormalities of gait and mobility: Secondary | ICD-10-CM | POA: Diagnosis present

## 2023-01-14 DIAGNOSIS — M6281 Muscle weakness (generalized): Secondary | ICD-10-CM | POA: Diagnosis present

## 2023-01-14 DIAGNOSIS — M25572 Pain in left ankle and joints of left foot: Secondary | ICD-10-CM | POA: Insufficient documentation

## 2023-01-14 NOTE — Therapy (Signed)
OUTPATIENT PHYSICAL THERAPY LOWER EXTREMITY TREATMENT   Patient Name: Ariana Jarvis MRN: [id number] DOB:09/09/1960, 62 y.o., female Today's Date: 01/14/2023  END OF SESSION:  PT End of Session - 01/14/23 1441     Visit Number 6    Date for PT Re-Evaluation 02/12/23    Authorization Type [employer] - 8 visits approved 9/5 to 11/4    Authorization Time Period 12/18/22 to 02/16/23    Authorization - Visit Number 5    Authorization - Number of Visits 8    PT Start Time 1445    PT Stop Time 1528    PT Time Calculation (min) 43 min    Activity Tolerance Patient tolerated treatment well                  Past Medical History:  Diagnosis Date   Anxiety    Arthritis    back   Back pain    Chronic back pain    Depression    Gastric ulcer    GERD (gastroesophageal reflux disease)    HLD (hyperlipidemia)    Hypertension    Past Surgical History:  Procedure Laterality Date   ANKLE ARTHROSCOPY WITH ARTHRODESIS Left 09/09/2022   Procedure: LEFT TIBIOTALAR JOINT ARTHRODESIS, DISTAL TIBIOFIBULAR JOINT ARTHRODESIS;  Surgeon: Terance Hart, MD;  Location: [facility] OR;  Service: Orthopedics;  Laterality: Left;  LENGTH OF SURGERY: 150 MINUTES   CESAREAN SECTION     x2   CHOLECYSTECTOMY     COLONOSCOPY     HARDWARE REMOVAL Left 05/16/2022   Procedure: HARDWARE REMOVAL LEFT ANKLE;  Surgeon: Joen Laura, MD;  Location: [facility];  Service: Orthopedics;  Laterality: Left;   ORIF ANKLE FRACTURE Left 03/14/2022   Procedure: OPEN REDUCTION INTERNAL FIXATION (ORIF) ANKLE FRACTURE;  Surgeon: Joen Laura, MD;  Location: [facility];  Service: Orthopedics;  Laterality: Left;   ORIF RADIAL FRACTURE Left 03/14/2022   Procedure: RADIAL HEAD ARTHROPLASTY;  Surgeon: Joen Laura, MD;  Location: [facility];  Service: Orthopedics;  Laterality: Left;   SYNDESMOSIS REPAIR Left 09/09/2022   Procedure: OPEN REDUCTION OF  SYNDESMOSIS;  Surgeon: Terance Hart, MD;  Location: [facility] OR;  Service: Orthopedics;  Laterality: Left;   Patient Active Problem List   Diagnosis Date Noted   Arthritis of left ankle 09/09/2022    PCP: Quitman Livings, MD  REFERRING PROVIDER: Nicki Guadalajara, MD  REFERRING DIAG: s/p Lt ankle arthrodesis of tibiotalar and tibiofibular joints  THERAPY DIAG:  Pain in left ankle and joints of left foot  Muscle weakness (generalized)  Other abnormalities of gait and mobility  Rationale for Evaluation and Treatment: Rehabilitation  ONSET DATE: 09/09/22 surgery date, had traumatic fracture approx 3 mos before surgery  SUBJECTIVE:   SUBJECTIVE STATEMENT:  This foot hurts all the time.  It hurt especially at night.  It stays swollen normally.  It usually hurts a little more after PT sessions but I expect that.  It's going on 1 year in November.  4 surgeries in all.    Goes by Ariana Jarvis   Pt is just over 3 mos s/p Lt tibiotalar and tibiofibular joint arthrodesis performed by Nicki Guadalajara, MD on 09/09/22.  She suffered a Lt ankle fracture with ORIF 03/14/22 and hardware removal from this in Feb 2024.  However, she had a post-traumatic collapse and avascular necrosis, necessitating subsequent surgery. Pt arrives wearing a walking boot on Lt foot but has been cleared to WB through  Lt LE as tolerated.  She has a lace up/velcro brace to wear as needed.   At home I walk on it without the brace.  I wear slippers a lot at home b/c my foot and ankle are too big to put my foot in.  My foot and ankle can swell a lot.  I have numbness along the sides.  I will elevated it throughout the day after walking a lot to help the swelling.  PERTINENT HISTORY: HTN, HLD, back arthritis PAIN:  PAIN:  Are you having pain? Yes NPRS scale: 4/10 Pain location: Lt lateral and medial ankle Pain orientation: Left  PAIN TYPE: aching, dull, sharp, throbbing, and tight Pain description: constant  Aggravating factors:  standing and walking Relieving factors: pain meds, rest/elevation, using walking boot   PRECAUTIONS: Fall  RED FLAGS: None   WEIGHT BEARING RESTRICTIONS:  cleared to WB through Lt LE as tolerated  FALLS:  Has patient fallen in last 6 months? Yes. Number of falls knee scooter tipped 1x  LIVING ENVIRONMENT: Lives with: lives with their family and lives alone Lives in: House/apartment Stairs: No Has following equipment at home:  walking boot for Lt foot as needed, has a wheelchair if needs to walk very far  OCCUPATION: unemployed  PLOF: Independent  PATIENT GOALS: get stronger, feel more confident and less pain walking with shoe on vs boot  NEXT MD VISIT: wanted to see her in 6 weeks  OBJECTIVE:   DIAGNOSTIC FINDINGS:   PATIENT SURVEYS:  FOTO 52% goal 57%  COGNITION: Overall cognitive status: Within functional limits for tasks assessed     SENSATION: Numbness along sides of Lt ankle since surgery medial> lateral  EDEMA:  Figure 8: Lt 55cm, Rt 51cm  MUSCLE LENGTH: Lt Gastroc limited 50% WNL hips and knees bil  POSTURE: No Significant postural limitations  PALPATION: Both tender and numb along medial and lateral aspects of ankle and foot  LOWER EXTREMITY ROM: Lt ankle 0 deg DF, 41 deg PF Rt ankle 12 deg DF, 55 deg PF   LOWER EXTREMITY MMT:  Bil hips 5/5  Rt knee 5/5 Lt knee 4/5  Rt ankle 5/5  Lt ankle 3+/5 all cardinal planes of motion    FUNCTIONAL TESTS:  5 times sit to stand: 15.13 hands on thighs Timed up and go (TUG): 12.95 wearing walking boot 2 minute walk test: 31' with Lt walking boot  GAIT: Distance walked: 312' Assistive device utilized:  wearing Lt walking boot Level of assistance: Modified independence Comments: some lateral ankle weakness within boot   TODAY'S TREATMENT:                                                                                                                              DATE:  01/14/23: Nu-Step L1 x 5  min (blue machine) while discussing status Seated Rockerboard x 3 min  Seated Pink power cord ankle PF 20x  WB on left with circles  touch with right foot 3 rounds of about 1 minute each (bil UE support on railing) Seated heel raise with 8# resting on left knee 2x10 Standing at the bottom of the steps: left foot on 2nd step rocking 10x Standing at the bottom of the steps: WB on left with step taps with right 10x Vasocompression moderate compression coldest setting 10 min  with elevation  01/08/23: Bike L3 x 5 min Rockerboard x 1 min increases pain in heel Gait: focusing on foot position, step and stride length Heel raises x 10 seated Standing hip ABD and EXT x 20 ea B yellow loop Manual: STM to L plantar fascia x 5 min  01/06/23  Bike L2 x 5 min Rockerboard x 2 min  Heel raises x 10 Baps L 1 PF/DF/EVER/INV, circles CW/CCW x 10 ea Standing hip ABD x 20  4 way ankle Tband: red x 10 ea, then green for inv/ever x 10 ea  01/01/23 Bike L2 x 6 min Standing hip IR for stretch 3x30 sec - standing turning left foot to neutral Standing hip ABD and ext x 20 ea B Weight shifting side to side, semi tandem stance and step through on L LE Seated arch lifts seated and standing x 10. Seated Ankle pumps and circles Self care: discussed compression socks to help with edema. Manual retrograde massage to left ankle with IASTM using metal Graston tool.    12/30/22 Bike L2 x 5 min Ankle pumps and circles in supine and sitting post manual Seated PNF diagonals x 10 ea SLR x 10 L LAQ x 5 for review Manual edema resorption to L ankle x 20 min Vaso mod pressure; 3 snowflakes x 10 min to L ankle, elevated    PATIENT EDUCATION:  Education details: [email or web address] Person educated: Patient Education method: Explanation, Verbal cues, and Handouts Education comprehension: verbalized understanding  HOME EXERCISE PROGRAM: Access Code: [email or web address] URL: [email or web address] Date: 01/06/2023 Prepared  by: Raynelle Fanning  Exercises - Ankle Pumps in Elevation  - 2 x daily - 7 x weekly - 2 sets - 10 reps - Ankle Circles in Elevation  - 2 x daily - 7 x weekly - 2 sets - 10 reps - Supine Active Straight Leg Raise  - 2 x daily - 7 x weekly - 2 sets - 10 reps - Sit to Stand  - 2 x daily - 7 x weekly - 3 sets - 5 reps - Seated Long Arc Quad  - 2 x daily - 7 x weekly - 2 sets - 10 reps - Standing Hip Abduction with Counter Support  - 2 x daily - 7 x weekly - 2 sets - 10 reps - Standing Hip Extension with Counter Support  - 2 x daily - 7 x weekly - 2 sets - 10 reps - Seated Ankle Eversion with Resistance  - 1 x daily - 3-4 x weekly - 1 sets - 10 reps - Seated Ankle Inversion with Resistance  - 1 x daily - 3-4 x weekly - 1 sets - 10 reps - Heel Raises with Counter Support  - 1 x daily - 3-4 x weekly - 1 sets - 10 reps  ASSESSMENT:  CLINICAL IMPRESSION: The patient is able to increase intensity of exercise with focus on strengthening gastroc/soleus muscles and weight bearing tolerance.  Responds well to alternating seated and standing exercise with pain level staying the same throughout session.  Some improvement in edema following vasocompression. Therapist monitoring response to all interventions and modifying treatment accordingly.  OBJECTIVE IMPAIRMENTS: Abnormal gait, decreased activity tolerance, decreased balance, decreased coordination, decreased mobility, difficulty walking, decreased ROM, decreased strength, hypomobility, increased edema, impaired sensation, and pain.   ACTIVITY LIMITATIONS: standing, squatting, sleeping, stairs, transfers, and locomotion level  PARTICIPATION LIMITATIONS: cleaning, laundry, shopping, and community activity  PERSONAL FACTORS: Time since onset of injury/illness/exacerbation and 1 comorbidity: 3 surgeries to Lt ankle with complications following ORIF  are also affecting patient's functional outcome.   REHAB POTENTIAL: Excellent  CLINICAL DECISION MAKING:  Stable/uncomplicated  EVALUATION COMPLEXITY: Low   GOALS: Goals reviewed with patient? Yes  SHORT TERM GOALS: Target date: 01/15/23 Pt will be ind with initial HEP Baseline: Goal status: met 10/2  2.  Pt will be compliant with edema management using elevation above heart and ice at least 2x/day. Baseline:  Goal status: IN PROGRESS   3.  Pt will improve Lt ankle strength to at least 4/5 Baseline:  Goal status: INITIAL  4.  Pt will improve TUG test to 11 sec Baseline:  Goal status: INITIAL  5.  Pt will be able to perform covering 350' with sneaker and lace up brace Baseline:  Goal status: INITIAL    LONG TERM GOALS: Target date: 02/12/23  Pt will be ind with advanced HEP Baseline:  Goal status: INITIAL  2.  Pt will achieve at least 4+/5 strength throughout Lt LE for gait, transfers, curbs and functional task performance. Baseline:  Goal status: INITIAL  3.  Improve FOTO score to at least 57% to demo improved functional performance. Baseline: 52% Goal status: INITIAL  4.  Pt will be able to participate in covering 500' in sneaker with lace up brace for improved community outings. Baseline:  Goal status: INITIAL  5.  Pt will be able to sleep through night with no or min pain interruption from Lt ankle. Baseline:  Goal status: INITIAL  6.  Pt will report improved Lt ankle pain to no greater than 6/10 with daily tasks. Baseline:  Goal status: INITIAL   PLAN:  PT FREQUENCY: 2x/week  PT DURATION: 8 weeks  PLANNED INTERVENTIONS: Therapeutic exercises, Therapeutic activity, Neuromuscular re-education, Balance training, Gait training, Patient/Family education, Self Care, Stair training, DME instructions, Aquatic Therapy, Dry Needling, Cryotherapy, Taping, Vasopneumatic device, Ionotophoresis 4mg /ml Dexamethasone, and Manual therapy  PLAN FOR NEXT SESSION: check TUG and 3 MWT; ankle MMT for STGs; modify and progress HEP as needed, has Pt gotten into a  sneaker, work on Investment banker, operational with sneaker + lace up brace as able, Lt ankle strength/isometrics, Lt hip and knee strength  Lavinia Sharps, PT 01/14/23 3:23 PM Phone: [phone] Fax: [phone]

## 2023-01-15 ENCOUNTER — Encounter: Payer: Medicaid Other | Admitting: Physical Therapy

## 2023-01-16 ENCOUNTER — Ambulatory Visit
Admission: EM | Admit: 2023-01-16 | Discharge: 2023-01-16 | Disposition: A | Discharge status: Home / Self Care | Payer: Medicaid Other | Attending: Internal Medicine | Admitting: Internal Medicine

## 2023-01-16 DIAGNOSIS — H65191 Other acute nonsuppurative otitis media, right ear: Secondary | ICD-10-CM

## 2023-01-16 DIAGNOSIS — R591 Generalized enlarged lymph nodes: Secondary | ICD-10-CM

## 2023-01-16 MED ORDER — AMOXICILLIN-POT CLAVULANATE 875-125 MG PO TABS: 1.0000 | ORAL_TABLET | Freq: Two times a day (BID) | ORAL | 0 refills | Status: DC

## 2023-01-16 NOTE — ED Provider Notes (Signed)
[facility]    CSN: [id number] Arrival date & time: 01/16/23  1308      History   Chief Complaint Chief Complaint  Patient presents with   Otalgia    HPI Ariana Jarvis is a 62 y.o. female.   Patient presents with right inner ear pain and swelling surrounding the ear that started yesterday.  Patient denies that she has taken any medication for symptoms.  Denies nasal congestion, runny nose, cough, fever.   Otalgia   Past Medical History:  Diagnosis Date   Anxiety    Arthritis    back   Back pain    Chronic back pain    Depression    Gastric ulcer    GERD (gastroesophageal reflux disease)    HLD (hyperlipidemia)    Hypertension     Patient Active Problem List   Diagnosis Date Noted   Arthritis of left ankle 09/09/2022    Past Surgical History:  Procedure Laterality Date   ANKLE ARTHROSCOPY WITH ARTHRODESIS Left 09/09/2022   Procedure: LEFT TIBIOTALAR JOINT ARTHRODESIS, DISTAL TIBIOFIBULAR JOINT ARTHRODESIS;  Surgeon: Terance Hart, MD;  Location: [facility] OR;  Service: Orthopedics;  Laterality: Left;  LENGTH OF SURGERY: 150 MINUTES   CESAREAN SECTION     x2   CHOLECYSTECTOMY     COLONOSCOPY     HARDWARE REMOVAL Left 05/16/2022   Procedure: HARDWARE REMOVAL LEFT ANKLE;  Surgeon: Joen Laura, MD;  Location: [facility];  Service: Orthopedics;  Laterality: Left;   ORIF ANKLE FRACTURE Left 03/14/2022   Procedure: OPEN REDUCTION INTERNAL FIXATION (ORIF) ANKLE FRACTURE;  Surgeon: Joen Laura, MD;  Location: [facility];  Service: Orthopedics;  Laterality: Left;   ORIF RADIAL FRACTURE Left 03/14/2022   Procedure: RADIAL HEAD ARTHROPLASTY;  Surgeon: Joen Laura, MD;  Location: [facility];  Service: Orthopedics;  Laterality: Left;   SYNDESMOSIS REPAIR Left 09/09/2022   Procedure: OPEN REDUCTION OF SYNDESMOSIS;  Surgeon: Terance Hart, MD;  Location: [facility] OR;  Service: Orthopedics;   Laterality: Left;    OB History   No obstetric history on file.      Home Medications    Prior to Admission medications   Medication Sig Start Date End Date Taking? Authorizing Provider  amoxicillin-clavulanate (AUGMENTIN) 875-125 MG tablet Take 1 tablet by mouth every 12 (twelve) hours. 01/16/23  Yes Geraldina Parrott, Acie Fredrickson, FNP  aspirin (BAYER ASPIRIN) 325 MG tablet Take 1 tablet by mouth for 30 DAYS for blood clot prevention 09/10/22   [place], Jesse J, PA-C  atenolol (TENORMIN) 100 MG tablet Take 100 mg by mouth daily.    [provider]  chlorhexidine (HIBICLENS) 4 % external liquid Apply 15 mLs (1 Application total) topically as directed for 30 doses. Use as directed daily for 5 days every other week for 6 weeks. 09/09/22   [place], Jesse J, PA-C  citalopram (CELEXA) 40 MG tablet Take 40 mg by mouth daily.    [provider]  hydrALAZINE (APRESOLINE) 50 MG tablet Take 50 mg by mouth in the morning and at bedtime.    [provider]  lisinopril-hydrochlorothiazide (ZESTORETIC) 20-12.5 MG tablet Take 1 tablet by mouth daily.    [provider]  meloxicam (MOBIC) 15 MG tablet Take 15 mg by mouth daily.    [provider]  methocarbamol (ROBAXIN-750) 750 MG tablet Take 1 tablet (750 mg total) by mouth every 8 (eight) hours as needed for muscle spasms. 09/10/22  [place], Jesse J, PA-C  Multiple Vitamins-Minerals (HAIR SKIN & NAILS) TABS Take 2 tablets by mouth daily.    [provider]  omeprazole (PRILOSEC) 40 MG capsule Take 40 mg by mouth daily.    [provider]  oxyCODONE (ROXICODONE) 15 MG immediate release tablet Take 15 mg by mouth every 4 (four) hours as needed for pain.    [provider]  oxymorphone (OPANA ER) 10 MG 12 hr tablet Take 10 mg by mouth in the morning, at noon, and at bedtime. Patient not taking: Reported on 09/03/2022    [provider]  rosuvastatin (CRESTOR) 5 MG tablet Take 5 mg by mouth daily.     [provider]  solifenacin (VESICARE) 10 MG tablet Take 10 mg by mouth daily. 08/08/19   [provider]  Vitamin D, Ergocalciferol, (DRISDOL) 1.25 MG (50000 UNIT) CAPS capsule Take 50,000 Units by mouth once a week.    [provider]    Family History Family History  Problem Relation Age of Onset   Hypertension Mother    Hypertension Father    Cancer Father        lung     Social History Social History   Tobacco Use   Smoking status: Former    Types: Cigarettes   Smokeless tobacco: Never  Vaping Use   Vaping status: Never Used  Substance Use Topics   Alcohol use: Not Currently   Drug use: Never     Allergies   Tramadol   Review of Systems Review of Systems Per HPI  Physical Exam Triage Vital Signs ED Triage Vitals  Encounter Vitals Group     BP 01/16/23 1326 (!) 142/92     Systolic BP Percentile --      Diastolic BP Percentile --      Pulse Rate 01/16/23 1326 (!) 53     Resp 01/16/23 1326 16     Temp 01/16/23 1326 97.7 F (36.5 C)     Temp Source 01/16/23 1326 Oral     SpO2 01/16/23 1326 96 %     Weight --      Height --      Head Circumference --      Peak Flow --      Pain Score 01/16/23 1328 1     Pain Loc --      Pain Education --      Exclude from Growth Chart --    No data found.  Updated Vital Signs BP (!) 142/92 (BP Location: Left Arm)   Pulse (!) 53   Temp 97.7 F (36.5 C) (Oral)   Resp 16   SpO2 96%   Visual Acuity Right Eye Distance:   Left Eye Distance:   Bilateral Distance:    Right Eye Near:   Left Eye Near:    Bilateral Near:     Physical Exam Constitutional:      General: She is not in acute distress.    Appearance: Normal appearance. She is not toxic-appearing or diaphoretic.  HENT:     Head: Normocephalic and atraumatic.     Right Ear: Ear canal and external ear normal. No drainage, swelling or tenderness. A middle ear effusion is present. Tympanic membrane is not scarred, perforated,  erythematous or bulging.     Ears:     Comments: Purulent and dull discoloration present to the right TM. Eyes:     Extraocular Movements: Extraocular movements intact.     Conjunctiva/sclera: Conjunctivae normal.  Pulmonary:     Effort: Pulmonary effort is normal.  Lymphadenopathy:     Cervical: Cervical adenopathy present.     Right cervical: Superficial cervical adenopathy present.  Neurological:     General: No focal deficit present.     Mental Status: She is alert and oriented to person, place, and time. Mental status is at baseline.  Psychiatric:        Mood and Affect: Mood normal.        Behavior: Behavior normal.        Thought Content: Thought content normal.        Judgment: Judgment normal.      UC Treatments / Results  Labs (all labs ordered are listed, but only abnormal results are displayed) Labs Reviewed - No data to display  EKG   Radiology No results found.  Procedures Procedures (including critical care time)  Medications Ordered in UC Medications - No data to display  Initial Impression / Assessment and Plan / UC Course  I have reviewed the triage vital signs and the nursing notes.  Pertinent labs & imaging results that were available during my care of the patient were reviewed by me and considered in my medical decision making (see chart for details).     It appears the patient has right otitis media and surrounding lymph node swelling.  No signs of airway compromise.  Will treat with Augmentin.  Advised patient to monitor closely especially lymph node swelling and follow-up if it persists or worsens.  Patient verbalized understanding and was agreeable with plan. Final Clinical Impressions(s) / UC Diagnoses   Final diagnoses:  Other non-recurrent acute nonsuppurative otitis media of right ear  Lymphadenopathy     Discharge Instructions      You have an ear infection which is causing lymph node swelling.  I have prescribed an antibiotic  to treat this.    ED Prescriptions     Medication Sig Dispense Auth. Provider   amoxicillin-clavulanate (AUGMENTIN) 875-125 MG tablet Take 1 tablet by mouth every 12 (twelve) hours. 14 tablet [place], Acie Fredrickson, [place]      PDMP not reviewed this encounter.   Gustavus Bryant, [place] 01/16/23 1420

## 2023-01-16 NOTE — ED Triage Notes (Signed)
Pt states right ear pain and swelling since yesterday.

## 2023-01-16 NOTE — Discharge Instructions (Signed)
You have an ear infection which is causing lymph node swelling.  I have prescribed an antibiotic to treat this.

## 2023-01-19 ENCOUNTER — Encounter: Payer: Self-pay | Admitting: Physical Therapy

## 2023-01-19 ENCOUNTER — Ambulatory Visit: Payer: Medicaid Other | Admitting: Physical Therapy

## 2023-01-19 DIAGNOSIS — M6281 Muscle weakness (generalized): Secondary | ICD-10-CM

## 2023-01-19 DIAGNOSIS — M25572 Pain in left ankle and joints of left foot: Secondary | ICD-10-CM

## 2023-01-19 DIAGNOSIS — R2689 Other abnormalities of gait and mobility: Secondary | ICD-10-CM

## 2023-01-19 NOTE — Therapy (Signed)
OUTPATIENT PHYSICAL THERAPY LOWER EXTREMITY TREATMENT   Patient Name: Ariana Jarvis MRN: [id number] DOB:[birth year], 62 y.o., female Today's Date: 01/19/2023  END OF SESSION:  PT End of Session - 01/19/23 1101     Visit Number 7    Date for PT Re-Evaluation 02/12/23    Authorization Type [employer] - 8 visits approved 9/5 to 11/4    Authorization Time Period 12/18/22 to 02/16/23    Authorization - Visit Number 6    Authorization - Number of Visits 8    PT Start Time 1102    PT Stop Time 1155    PT Time Calculation (min) 53 min    Activity Tolerance Patient tolerated treatment well    Behavior During Therapy WFL for tasks assessed/performed                   Past Medical History:  Diagnosis Date   Anxiety    Arthritis    back   Back pain    Chronic back pain    Depression    Gastric ulcer    GERD (gastroesophageal reflux disease)    HLD (hyperlipidemia)    Hypertension    Past Surgical History:  Procedure Laterality Date   ANKLE ARTHROSCOPY WITH ARTHRODESIS Left 09/09/2022   Procedure: LEFT TIBIOTALAR JOINT ARTHRODESIS, DISTAL TIBIOFIBULAR JOINT ARTHRODESIS;  Surgeon: Terance Hart, MD;  Location: [facility] OR;  Service: Orthopedics;  Laterality: Left;  LENGTH OF SURGERY: 150 MINUTES   CESAREAN SECTION     x2   CHOLECYSTECTOMY     COLONOSCOPY     HARDWARE REMOVAL Left 05/16/2022   Procedure: HARDWARE REMOVAL LEFT ANKLE;  Surgeon: Joen Laura, MD;  Location: [facility];  Service: Orthopedics;  Laterality: Left;   ORIF ANKLE FRACTURE Left 03/14/2022   Procedure: OPEN REDUCTION INTERNAL FIXATION (ORIF) ANKLE FRACTURE;  Surgeon: Joen Laura, MD;  Location: [facility];  Service: Orthopedics;  Laterality: Left;   ORIF RADIAL FRACTURE Left 03/14/2022   Procedure: RADIAL HEAD ARTHROPLASTY;  Surgeon: Joen Laura, MD;  Location: [facility];  Service: Orthopedics;  Laterality: Left;    SYNDESMOSIS REPAIR Left 09/09/2022   Procedure: OPEN REDUCTION OF SYNDESMOSIS;  Surgeon: Terance Hart, MD;  Location: [facility] OR;  Service: Orthopedics;  Laterality: Left;   Patient Active Problem List   Diagnosis Date Noted   Arthritis of left ankle 09/09/2022    PCP: Quitman Livings, MD  REFERRING PROVIDER: Nicki Guadalajara, MD  REFERRING DIAG: s/p Lt ankle arthrodesis of tibiotalar and tibiofibular joints  THERAPY DIAG:  Pain in left ankle and joints of left foot  Muscle weakness (generalized)  Other abnormalities of gait and mobility  Rationale for Evaluation and Treatment: Rehabilitation  ONSET DATE: 09/09/22 surgery date, had traumatic fracture approx 3 mos before surgery  SUBJECTIVE:   SUBJECTIVE STATEMENT: I am not doing much at home for my ankle.  My swelling is getting better.  I am fully out of the boot and am wearing my sandals pretty well.  My old sneakers are still pretty tight to put on and the new ones I bought are too big.   My pain goes up as activity goes up.  Goes by [name]   Pt is just over 3 mos s/p Lt tibiotalar and tibiofibular joint arthrodesis performed by Nicki Guadalajara, MD on 09/09/22.  She suffered a Lt ankle fracture with ORIF 03/14/22 and hardware removal from this in Feb 2024.  However, she had  a post-traumatic collapse and avascular necrosis, necessitating subsequent surgery. Pt arrives wearing a walking boot on Lt foot but has been cleared to WB through Lt LE as tolerated.  She has a lace up/velcro brace to wear as needed.   At home I walk on it without the brace.  I wear slippers a lot at home b/c my foot and ankle are too big to put my foot in.  My foot and ankle can swell a lot.  I have numbness along the sides.  I will elevated it throughout the day after walking a lot to help the swelling.  PERTINENT HISTORY: HTN, HLD, back arthritis PAIN:  PAIN:  Are you having pain? Yes NPRS scale: 3/10 Pain location: Lt lateral and medial ankle Pain  orientation: Left  PAIN TYPE: aching, dull, sharp, throbbing, and tight Pain description: constant  Aggravating factors: standing and walking Relieving factors: pain meds, rest/elevation, using walking boot   PRECAUTIONS: Fall  RED FLAGS: None   WEIGHT BEARING RESTRICTIONS:  cleared to WB through Lt LE as tolerated  FALLS:  Has patient fallen in last 6 months? Yes. Number of falls knee scooter tipped 1x  LIVING ENVIRONMENT: Lives with: lives with their family and lives alone Lives in: House/apartment Stairs: No Has following equipment at home:  walking boot for Lt foot as needed, has a wheelchair if needs to walk very far  OCCUPATION: unemployed  PLOF: Independent  PATIENT GOALS: get stronger, feel more confident and less pain walking with shoe on vs boot  NEXT MD VISIT: wanted to see her in 6 weeks  OBJECTIVE:   DIAGNOSTIC FINDINGS:   PATIENT SURVEYS:  FOTO: 63% MET GOAL FOTO 52% goal 57%  COGNITION: Overall cognitive status: Within functional limits for tasks assessed     SENSATION: Numbness along sides of Lt ankle since surgery medial> lateral  EDEMA:  Figure 8: Lt 55cm, Rt 51cm  MUSCLE LENGTH: Lt Gastroc limited 50% WNL hips and knees bil  POSTURE: No Significant postural limitations  PALPATION: Both tender and numb along medial and lateral aspects of ankle and foot  LOWER EXTREMITY ROM: Lt ankle 0 deg DF, 41 deg PF Rt ankle 12 deg DF, 55 deg PF   LOWER EXTREMITY MMT:  Bil hips 5/5  Rt knee 5/5 Lt knee 4/5  Rt ankle 5/5  Lt ankle 3+/5 all cardinal planes of motion    FUNCTIONAL TESTS:  10/7:  5x STS: 10.41 TUG: 9.92 3 min walk test: 563'  Eval: 5 times sit to stand: 15.13 hands on thighs Timed up and go (TUG): 12.95 wearing walking boot 2 minute walk test: 7' with Lt walking boot  GAIT: Distance walked: 312' Assistive device utilized:  wearing Lt walking boot Level of assistance: Modified independence Comments: some lateral  ankle weakness within boot   TODAY'S TREATMENT:  DATE:  01/19/23: NuStep L3 x 6' (blue machine) PT present to review status  Seated Lt ankle A/ROM x10 each: circles CW/CCW, DF/PF, inv/ever Lt ankle rockerboard x2' Seated heel raise with 8# resting on left knee 2x10 Blue tband ankle inversion, eversion, PF 2x10 each TUG, 5X STS, , FOTO 4" step up fwd and lateral x10 each, Lt LE Lt SLS with single rail clock taps Rt LE 2 rounds Standing at the bottom of the steps: left foot on 2nd step rocking 10x Vasocompression moderate compression coldest setting 10 min  with elevation   01/14/23: Nu-Step L1 x 5 min (blue machine) while discussing status Seated Rockerboard x 3 min  Seated Pink power cord ankle PF 20x  WB on left with circles touch with right foot 3 rounds of about 1 minute each (bil UE support on railing) Seated heel raise with 8# resting on left knee 2x10 Standing at the bottom of the steps: left foot on 2nd step rocking 10x Standing at the bottom of the steps: WB on left with step taps with right 10x Vasocompression moderate compression coldest setting 10 min  with elevation  01/08/23: Bike L3 x 5 min Rockerboard x 1 min increases pain in heel Gait: focusing on foot position, step and stride length Heel raises x 10 seated Standing hip ABD and EXT x 20 ea B yellow loop Manual: STM to L plantar fascia x 5 min  PATIENT EDUCATION:  Education details: [email or web address] Person educated: Patient Education method: Explanation, Verbal cues, and Handouts Education comprehension: verbalized understanding  HOME EXERCISE PROGRAM: Access Code: [email or web address] URL: [email or web address] Date: 01/06/2023 Prepared by: Raynelle Fanning  Exercises - Ankle Pumps in Elevation  - 2 x daily - 7 x weekly - 2 sets - 10 reps - Ankle Circles in Elevation  - 2 x daily - 7 x weekly  - 2 sets - 10 reps - Supine Active Straight Leg Raise  - 2 x daily - 7 x weekly - 2 sets - 10 reps - Sit to Stand  - 2 x daily - 7 x weekly - 3 sets - 5 reps - Seated Long Arc Quad  - 2 x daily - 7 x weekly - 2 sets - 10 reps - Standing Hip Abduction with Counter Support  - 2 x daily - 7 x weekly - 2 sets - 10 reps - Standing Hip Extension with Counter Support  - 2 x daily - 7 x weekly - 2 sets - 10 reps - Seated Ankle Eversion with Resistance  - 1 x daily - 3-4 x weekly - 1 sets - 10 reps - Seated Ankle Inversion with Resistance  - 1 x daily - 3-4 x weekly - 1 sets - 10 reps - Heel Raises with Counter Support  - 1 x daily - 3-4 x weekly - 1 sets - 10 reps  ASSESSMENT:  CLINICAL IMPRESSION: Pt admits she is not doing her HEP program consistently.  She will have pain as the day progresses with increased activity.  She is wearing slide sandals with firm sole due to old sneakers still too tight and new sneakers too big. She has slight edema surrounding ankle so we continue to encourage elevation/ice at home and to use ice bottle massage to plantar fascia, and continue to use vasoconstriction end of session.  Pt has met goals today for FOTO, TUG AND today.  She is working on load tolerance through Lt LE for SLS and step ups.  Pt reported only slight  increase in pain during session today with SLS tasks going from 3/10 to 4/10.  PT encouraged her to schedule her 6 week follow up with her surgeon as her timeline suggests she is due in mid-Oct.     OBJECTIVE IMPAIRMENTS: Abnormal gait, decreased activity tolerance, decreased balance, decreased coordination, decreased mobility, difficulty walking, decreased ROM, decreased strength, hypomobility, increased edema, impaired sensation, and pain.   ACTIVITY LIMITATIONS: standing, squatting, sleeping, stairs, transfers, and locomotion level  PARTICIPATION LIMITATIONS: cleaning, laundry, shopping, and community activity  PERSONAL FACTORS: Time since  onset of injury/illness/exacerbation and 1 comorbidity: 3 surgeries to Lt ankle with complications following ORIF  are also affecting patient's functional outcome.   REHAB POTENTIAL: Excellent  CLINICAL DECISION MAKING: Stable/uncomplicated  EVALUATION COMPLEXITY: Low   GOALS: Goals reviewed with patient? Yes  SHORT TERM GOALS: Target date: 01/15/23 Pt will be ind with initial HEP Baseline: Goal status: met 10/2  2.  Pt will be compliant with edema management using elevation above heart and ice at least 2x/day. Baseline:  Goal status: IN PROGRESS   3.  Pt will improve Lt ankle strength to at least 4/5 Baseline:  Goal status: INITIAL  4.  Pt will improve TUG test to 11 sec Baseline:  Goal status: MET 10/7  5.  Pt will be able to perform covering 350' with sneaker and lace up brace Baseline:  Goal status: MET 10/7    LONG TERM GOALS: Target date: 02/12/23  Pt will be ind with advanced HEP Baseline:  Goal status: INITIAL  2.  Pt will achieve at least 4+/5 strength throughout Lt LE for gait, transfers, curbs and functional task performance. Baseline:  Goal status: INITIAL  3.  Improve FOTO score to at least 57% to demo improved functional performance. Baseline: 52% Goal status: INITIAL  4.  Pt will be able to participate in covering 750' in sneaker with lace up brace for improved community outings. Baseline:  Goal status: ONGOING  5.  Pt will be able to sleep through night with no or min pain interruption from Lt ankle. Baseline:  Goal status: ONGOING  6.  Pt will report improved Lt ankle pain to no greater than 6/10 with daily tasks. Baseline:  Goal status: MET 10/7   PLAN:  PT FREQUENCY: 2x/week  PT DURATION: 8 weeks  PLANNED INTERVENTIONS: Therapeutic exercises, Therapeutic activity, Neuromuscular re-education, Balance training, Gait training, Patient/Family education, Self Care, Stair training, DME instructions, Aquatic Therapy, Dry  Needling, Cryotherapy, Taping, Vasopneumatic device, Ionotophoresis 4mg /ml Dexamethasone, and Manual therapy  PLAN FOR NEXT SESSION: ankle MMT for STG; repeat or work toward , modify and progress HEP as needed, has Pt gotten into a sneaker, work on Investment banker, operational with sneaker + lace up brace as able, Lt ankle strength/isometrics, Lt hip and knee strength  Tayra Dawe, PT 01/19/23 11:52 AM  Phone: [phone] Fax: [phone]

## 2023-01-22 ENCOUNTER — Ambulatory Visit: Payer: Medicaid Other | Admitting: Physical Therapy

## 2023-01-23 ENCOUNTER — Ambulatory Visit: Payer: Medicaid Other

## 2023-01-26 NOTE — Therapy (Signed)
OUTPATIENT PHYSICAL THERAPY LOWER EXTREMITY TREATMENT   Patient Name: Ariana Jarvis MRN: [id number] DOB:[birth year], 62 y.o., female Today's Date: 01/27/2023  END OF SESSION:  PT End of Session - 01/27/23 1021     Visit Number 8    Date for PT Re-Evaluation 02/12/23    Authorization Type [employer] - 8 visits approved 9/5 to 11/4    Authorization Time Period 12/18/22 to 02/16/23    Authorization - Visit Number 7    Authorization - Number of Visits 8    PT Start Time 1019    Activity Tolerance Patient tolerated treatment well    Behavior During Therapy [facility] for tasks assessed/performed                    Past Medical History:  Diagnosis Date   Anxiety    Arthritis    back   Back pain    Chronic back pain    Depression    Gastric ulcer    GERD (gastroesophageal reflux disease)    HLD (hyperlipidemia)    Hypertension    Past Surgical History:  Procedure Laterality Date   ANKLE ARTHROSCOPY WITH ARTHRODESIS Left 09/09/2022   Procedure: LEFT TIBIOTALAR JOINT ARTHRODESIS, DISTAL TIBIOFIBULAR JOINT ARTHRODESIS;  Surgeon: Ariana Hart, MD;  Location: [facility] OR;  Service: Orthopedics;  Laterality: Left;  LENGTH OF SURGERY: 150 MINUTES   CESAREAN SECTION     x2   CHOLECYSTECTOMY     COLONOSCOPY     HARDWARE REMOVAL Left 05/16/2022   Procedure: HARDWARE REMOVAL LEFT ANKLE;  Surgeon: Ariana Laura, MD;  Location: [facility];  Service: Orthopedics;  Laterality: Left;   ORIF ANKLE FRACTURE Left 03/14/2022   Procedure: OPEN REDUCTION INTERNAL FIXATION (ORIF) ANKLE FRACTURE;  Surgeon: Ariana Laura, MD;  Location: [facility];  Service: Orthopedics;  Laterality: Left;   ORIF RADIAL FRACTURE Left 03/14/2022   Procedure: RADIAL HEAD ARTHROPLASTY;  Surgeon: Ariana Laura, MD;  Location: [facility];  Service: Orthopedics;  Laterality: Left;   SYNDESMOSIS REPAIR Left 09/09/2022   Procedure: OPEN REDUCTION OF  SYNDESMOSIS;  Surgeon: Ariana Hart, MD;  Location[facility] OR;  Service: Orthopedics;  Laterality: Left;   Patient Active Problem List   Diagnosis Date Noted   Arthritis of left ankle 09/09/2022    PCP: Ariana Livings, MD  REFERRING PROVIDER: Nicki Guadalajara, MD  REFERRING DIAG: s/p Lt ankle arthrodesis of tibiotalar and tibiofibular joints  THERAPY DIAG:  No diagnosis found.  Rationale for Evaluation and Treatment: Rehabilitation  ONSET DATE: 09/09/22 surgery date, had traumatic fracture approx 3 mos before surgery  SUBJECTIVE:   SUBJECTIVE STATEMENT: Pt states that she went to the drag races this weekend and her ankle has been pretty sore. She is wearing an tennis shoe. Notes that it doesn't seem to be swelling as much as usual.   Goes by Ariana Jarvis   Pt is just over 3 mos s/p Lt tibiotalar and tibiofibular joint arthrodesis performed by Ariana Guadalajara, MD on 09/09/22.  She suffered a Lt ankle fracture with ORIF 03/14/22 and hardware removal from this in Feb 2024.  However, she had a post-traumatic collapse and avascular necrosis, necessitating subsequent surgery. Pt arrives wearing a walking boot on Lt foot but has been cleared to WB through Lt LE as tolerated.  She has a lace up/velcro brace to wear as needed.   At home I walk on it without the brace.  I wear slippers a  lot at home b/c my foot and ankle are too big to put my foot in.  My foot and ankle can swell a lot.  I have numbness along the sides.  I will elevated it throughout the day after walking a lot to help the swelling.  PERTINENT HISTORY: HTN, HLD, back arthritis PAIN:  PAIN:  Are you having pain? Yes NPRS scale: 5 or 6/10 Pain location: Lt lateral and medial ankle Pain orientation: Left  PAIN TYPE: aching, dull, sharp, throbbing, and tight Pain description: constant  Aggravating factors: standing and walking Relieving factors: pain meds, rest/elevation, using walking boot   PRECAUTIONS: Fall  RED  FLAGS: None   WEIGHT BEARING RESTRICTIONS:  cleared to WB through Lt LE as tolerated  FALLS:  Has patient fallen in last 6 months? Yes. Number of falls knee scooter tipped 1x  LIVING ENVIRONMENT: Lives with: lives with their family and lives alone Lives in: House/apartment Stairs: No Has following equipment at home:  walking boot for Lt foot as needed, has a wheelchair if needs to walk very far  OCCUPATION: unemployed  PLOF: Independent  PATIENT GOALS: get stronger, feel more confident and less pain walking with shoe on vs boot  NEXT MD VISIT: wanted to see her in 6 weeks  OBJECTIVE:   DIAGNOSTIC FINDINGS:   PATIENT SURVEYS:  FOTO: 63% MET GOAL FOTO 52% goal 57%  COGNITION: Overall cognitive status: Within functional limits for tasks assessed     SENSATION: Numbness along sides of Lt ankle since surgery medial> lateral  EDEMA:  Figure 8: Lt 55cm, Rt 51cm  MUSCLE LENGTH: Lt Gastroc limited 50% WNL hips and knees bil  POSTURE: No Significant postural limitations  PALPATION: Both tender and numb along medial and lateral aspects of ankle and foot  LOWER EXTREMITY ROM:  01/27/23: Lt 10 DF, 12 PF  Eval: Lt ankle 0 deg DF, 41 deg PF Rt ankle 12 deg DF, 55 deg PF   LOWER EXTREMITY MMT:  Bil hips 5/5  Rt knee 5/5 Lt knee 4/5  Rt ankle 5/5  Lt ankle 3+/5 all cardinal planes of motion    FUNCTIONAL TESTS:   10/7:  5x STS: 10.41 TUG: 9.92 3 min walk test: 563'  Eval: 5 times sit to stand: 15.13 hands on thighs Timed up and go (TUG): 12.95 wearing walking boot 2 minute walk test: 47' with Lt walking boot  GAIT: Distance walked: 312' Assistive device utilized:  wearing Lt walking boot Level of assistance: Modified independence Comments: some lateral ankle weakness within boot   TODAY'S TREATMENT:                                                                                                                              DATE:   01/27/23 Walking x56min: PT cues to focus on proper push off and decrease foot eversion Seated Lt heel raise, 8# on thigh x20 Seated DF x20 Lt ankle slides into PF/DF  2x10 Vaso end of session x10 min, medium compression, coldest temp Manual:  AP talocrural mobs grade III-IV Mid foot stretches and mobilization grade IV Proximal fibular mobs AP grade IV      01/19/23: NuStep L3 x 6' (blue machine) PT present to review status  Seated Lt ankle A/ROM x10 each: circles CW/CCW, DF/PF, inv/ever Lt ankle rockerboard x2' Seated heel raise with 8# resting on left knee 2x10 Blue tband ankle inversion, eversion, PF 2x10 each TUG, 5X STS, , FOTO 4" step up fwd and lateral x10 each, Lt LE Lt SLS with single rail clock taps Rt LE 2 rounds Standing at the bottom of the steps: left foot on 2nd step rocking 10x Vasocompression moderate compression coldest setting 10 min  with elevation   01/14/23: Nu-Step L1 x 5 min (blue machine) while discussing status Seated Rockerboard x 3 min  Seated Pink power cord ankle PF 20x  WB on left with circles touch with right foot 3 rounds of about 1 minute each (bil UE support on railing) Seated heel raise with 8# resting on left knee 2x10 Standing at the bottom of the steps: left foot on 2nd step rocking 10x Standing at the bottom of the steps: WB on left with step taps with right 10x Vasocompression moderate compression coldest setting 10 min  with elevation  01/08/23: Bike L3 x 5 min Rockerboard x 1 min increases pain in heel Gait: focusing on foot position, step and stride length Heel raises x 10 seated Standing hip ABD and EXT x 20 ea B yellow loop Manual: STM to L plantar fascia x 5 min  PATIENT EDUCATION:  Education details: [email or web address] Person educated: Patient Education method: Explanation, Verbal cues, and Handouts Education comprehension: verbalized understanding  HOME EXERCISE PROGRAM: Access Code: [email or web address] URL:  [email or web address] Date: 01/06/2023 Prepared by: Raynelle Fanning  Exercises - Ankle Pumps in Elevation  - 2 x daily - 7 x weekly - 2 sets - 10 reps - Ankle Circles in Elevation  - 2 x daily - 7 x weekly - 2 sets - 10 reps - Supine Active Straight Leg Raise  - 2 x daily - 7 x weekly - 2 sets - 10 reps - Sit to Stand  - 2 x daily - 7 x weekly - 3 sets - 5 reps - Seated Long Arc Quad  - 2 x daily - 7 x weekly - 2 sets - 10 reps - Standing Hip Abduction with Counter Support  - 2 x daily - 7 x weekly - 2 sets - 10 reps - Standing Hip Extension with Counter Support  - 2 x daily - 7 x weekly - 2 sets - 10 reps - Seated Ankle Eversion with Resistance  - 1 x daily - 3-4 x weekly - 1 sets - 10 reps - Seated Ankle Inversion with Resistance  - 1 x daily - 3-4 x weekly - 1 sets - 10 reps - Heel Raises with Counter Support  - 1 x daily - 3-4 x weekly - 1 sets - 10 reps  ASSESSMENT:  CLINICAL IMPRESSION: Pt had increase in ankle soreness today after being on her feet all day Saturday. She was able to walk for up to 4 minutes despite her increase in pain and PT educated on proper mechanics for heel/toe contact. Pts ankle DF ROM has increased by 10 deg since her evaluation. Ankle ROM still remains significantly limited. Ended with vasopneumatic device for pain and swelling control.     OBJECTIVE IMPAIRMENTS:  Abnormal gait, decreased activity tolerance, decreased balance, decreased coordination, decreased mobility, difficulty walking, decreased ROM, decreased strength, hypomobility, increased edema, impaired sensation, and pain.   ACTIVITY LIMITATIONS: standing, squatting, sleeping, stairs, transfers, and locomotion level  PARTICIPATION LIMITATIONS: cleaning, laundry, shopping, and community activity  PERSONAL FACTORS: Time since onset of injury/illness/exacerbation and 1 comorbidity: 3 surgeries to Lt ankle with complications following ORIF  are also affecting patient's functional outcome.    REHAB POTENTIAL: Excellent  CLINICAL DECISION MAKING: Stable/uncomplicated  EVALUATION COMPLEXITY: Low   GOALS: Goals reviewed with patient? Yes  SHORT TERM GOALS: Target date: 01/15/23 Pt will be ind with initial HEP Baseline: Goal status: met 10/2  2.  Pt will be compliant with edema management using elevation above heart and ice at least 2x/day. Baseline:  Goal status: IN PROGRESS   3.  Pt will improve Lt ankle strength to at least 4/5 Baseline:  Goal status: INITIAL  4.  Pt will improve TUG test to 11 sec Baseline:  Goal status: MET 10/7  5.  Pt will be able to perform covering 350' with sneaker and lace up brace Baseline:  Goal status: MET 10/7    LONG TERM GOALS: Target date: 02/12/23  Pt will be ind with advanced HEP Baseline:  Goal status: INITIAL  2.  Pt will achieve at least 4+/5 strength throughout Lt LE for gait, transfers, curbs and functional task performance. Baseline:  Goal status: INITIAL  3.  Improve FOTO score to at least 57% to demo improved functional performance. Baseline: 52% Goal status: INITIAL  4.  Pt will be able to participate in covering 750' in sneaker with lace up brace for improved community outings. Baseline:  Goal status: ONGOING  5.  Pt will be able to sleep through night with no or min pain interruption from Lt ankle. Baseline:  Goal status: ONGOING  6.  Pt will report improved Lt ankle pain to no greater than 6/10 with daily tasks. Baseline:  Goal status: MET 10/7   PLAN:  PT FREQUENCY: 2x/week  PT DURATION: 8 weeks  PLANNED INTERVENTIONS: Therapeutic exercises, Therapeutic activity, Neuromuscular re-education, Balance training, Gait training, Patient/Family education, Self Care, Stair training, DME instructions, Aquatic Therapy, Dry Needling, Cryotherapy, Taping, Vasopneumatic device, Ionotophoresis 4mg /ml Dexamethasone, and Manual therapy  PLAN FOR NEXT SESSION: recert; work toward , modify and  progress HEP as needed, work on Investment banker, operational with sneaker + lace up brace as able, Lt ankle strength/isometrics, Lt hip and knee strength  11:56 AM,01/27/23 Donita Brooks PT, DPT [facility] [place]  [phone]

## 2023-01-27 ENCOUNTER — Encounter: Payer: Self-pay | Admitting: Physical Therapy

## 2023-01-27 ENCOUNTER — Ambulatory Visit: Payer: Medicaid Other | Admitting: Physical Therapy

## 2023-01-27 DIAGNOSIS — M6281 Muscle weakness (generalized): Secondary | ICD-10-CM

## 2023-01-27 DIAGNOSIS — M25572 Pain in left ankle and joints of left foot: Secondary | ICD-10-CM

## 2023-01-27 DIAGNOSIS — R2689 Other abnormalities of gait and mobility: Secondary | ICD-10-CM

## 2023-01-28 NOTE — Therapy (Unsigned)
OUTPATIENT PHYSICAL THERAPY LOWER EXTREMITY TREATMENT   Patient Name: Ariana Jarvis MRN: [id number] DOB:May 12, 1960, 62 y.o., female Today's Date: 01/28/2023  END OF SESSION:           Past Medical History:  Diagnosis Date   Anxiety    Arthritis    back   Back pain    Chronic back pain    Depression    Gastric ulcer    GERD (gastroesophageal reflux disease)    HLD (hyperlipidemia)    Hypertension    Past Surgical History:  Procedure Laterality Date   ANKLE ARTHROSCOPY WITH ARTHRODESIS Left 09/09/2022   Procedure: LEFT TIBIOTALAR JOINT ARTHRODESIS, DISTAL TIBIOFIBULAR JOINT ARTHRODESIS;  Surgeon: Terance Hart, MD;  Location: [facility] OR;  Service: Orthopedics;  Laterality: Left;  LENGTH OF SURGERY: 150 MINUTES   CESAREAN SECTION     x2   CHOLECYSTECTOMY     COLONOSCOPY     HARDWARE REMOVAL Left 05/16/2022   Procedure: HARDWARE REMOVAL LEFT ANKLE;  Surgeon: Joen Laura, MD;  Location: [facility];  Service: Orthopedics;  Laterality: Left;   ORIF ANKLE FRACTURE Left 03/14/2022   Procedure: OPEN REDUCTION INTERNAL FIXATION (ORIF) ANKLE FRACTURE;  Surgeon: Joen Laura, MD;  Location: [facility];  Service: Orthopedics;  Laterality: Left;   ORIF RADIAL FRACTURE Left 03/14/2022   Procedure: RADIAL HEAD ARTHROPLASTY;  Surgeon: Joen Laura, MD;  Location: [facility];  Service: Orthopedics;  Laterality: Left;   SYNDESMOSIS REPAIR Left 09/09/2022   Procedure: OPEN REDUCTION OF SYNDESMOSIS;  Surgeon: Terance Hart, MD;  Location: [facility] OR;  Service: Orthopedics;  Laterality: Left;   Patient Active Problem List   Diagnosis Date Noted   Arthritis of left ankle 09/09/2022    PCP: Quitman Livings, MD  REFERRING PROVIDER: Nicki Guadalajara, MD  REFERRING DIAG: s/p Lt ankle arthrodesis of tibiotalar and tibiofibular joints  THERAPY DIAG:  No diagnosis found.  Rationale for Evaluation and Treatment:  Rehabilitation  ONSET DATE: 09/09/22 surgery date, had traumatic fracture approx 3 mos before surgery  SUBJECTIVE:   SUBJECTIVE STATEMENT: Pt states that she went to the drag races this weekend and her ankle has been pretty sore. She is wearing an tennis shoe. Notes that it doesn't seem to be swelling as much as usual.   Goes by Ariana Jarvis   Pt is just over 3 mos s/p Lt tibiotalar and tibiofibular joint arthrodesis performed by Nicki Guadalajara, MD on 09/09/22.  She suffered a Lt ankle fracture with ORIF 03/14/22 and hardware removal from this in Feb 2024.  However, she had a post-traumatic collapse and avascular necrosis, necessitating subsequent surgery. Pt arrives wearing a walking boot on Lt foot but has been cleared to WB through Lt LE as tolerated.  She has a lace up/velcro brace to wear as needed.   At home I walk on it without the brace.  I wear slippers a lot at home b/c my foot and ankle are too big to put my foot in.  My foot and ankle can swell a lot.  I have numbness along the sides.  I will elevated it throughout the day after walking a lot to help the swelling.  PERTINENT HISTORY: HTN, HLD, back arthritis PAIN:  PAIN:  Are you having pain? Yes NPRS scale: 5 or 6/10 Pain location: Lt lateral and medial ankle Pain orientation: Left  PAIN TYPE: aching, dull, sharp, throbbing, and tight Pain description: constant  Aggravating factors: standing and walking Relieving  factors: pain meds, rest/elevation, using walking boot   PRECAUTIONS: Fall  RED FLAGS: None   WEIGHT BEARING RESTRICTIONS:  cleared to WB through Lt LE as tolerated  FALLS:  Has patient fallen in last 6 months? Yes. Number of falls knee scooter tipped 1x  LIVING ENVIRONMENT: Lives with: lives with their family and lives alone Lives in: House/apartment Stairs: No Has following equipment at home:  walking boot for Lt foot as needed, has a wheelchair if needs to walk very far  OCCUPATION: unemployed  PLOF:  Independent  PATIENT GOALS: get stronger, feel more confident and less pain walking with shoe on vs boot  NEXT MD VISIT: wanted to see her in 6 weeks  OBJECTIVE:   DIAGNOSTIC FINDINGS:   PATIENT SURVEYS:  FOTO: 63% MET GOAL FOTO 52% goal 57%  COGNITION: Overall cognitive status: Within functional limits for tasks assessed     SENSATION: Numbness along sides of Lt ankle since surgery medial> lateral  EDEMA:  Figure 8: Lt 55cm, Rt 51cm  MUSCLE LENGTH: Lt Gastroc limited 50% WNL hips and knees bil  POSTURE: No Significant postural limitations  PALPATION: Both tender and numb along medial and lateral aspects of ankle and foot  LOWER EXTREMITY ROM:  01/27/23: Lt 10 DF, 12 PF  Eval: Lt ankle 0 deg DF, 41 deg PF Rt ankle 12 deg DF, 55 deg PF   LOWER EXTREMITY MMT:  Bil hips 5/5  Rt knee 5/5 Lt knee 4/5  Rt ankle 5/5  Lt ankle 3+/5 all cardinal planes of motion    FUNCTIONAL TESTS:   10/7:  5x STS: 10.41 TUG: 9.92 3 min walk test: 563'  Eval: 5 times sit to stand: 15.13 hands on thighs Timed up and go (TUG): 12.95 wearing walking boot 2 minute walk test: 31' with Lt walking boot  GAIT: Distance walked: 312' Assistive device utilized:  wearing Lt walking boot Level of assistance: Modified independence Comments: some lateral ankle weakness within boot   TODAY'S TREATMENT:                                                                                                                              DATE:  01/27/23 Walking x30min: PT cues to focus on proper push off and decrease foot eversion Seated Lt heel raise, 8# on thigh x20 Seated DF x20 Lt ankle slides into PF/DF 2x10 Vaso end of session x10 min, medium compression, coldest temp Manual:  AP talocrural mobs grade III-IV Mid foot stretches and mobilization grade IV Proximal fibular mobs AP grade IV      01/19/23: NuStep L3 x 6' (blue machine) PT present to review status  Seated Lt ankle  A/ROM x10 each: circles CW/CCW, DF/PF, inv/ever Lt ankle rockerboard x2' Seated heel raise with 8# resting on left knee 2x10 Blue tband ankle inversion, eversion, PF 2x10 each TUG, 5X STS, , FOTO 4" step up fwd and lateral x10 each, Lt LE Lt SLS  with single rail clock taps Rt LE 2 rounds Standing at the bottom of the steps: left foot on 2nd step rocking 10x Vasocompression moderate compression coldest setting 10 min  with elevation   01/14/23: Nu-Step L1 x 5 min (blue machine) while discussing status Seated Rockerboard x 3 min  Seated Pink power cord ankle PF 20x  WB on left with circles touch with right foot 3 rounds of about 1 minute each (bil UE support on railing) Seated heel raise with 8# resting on left knee 2x10 Standing at the bottom of the steps: left foot on 2nd step rocking 10x Standing at the bottom of the steps: WB on left with step taps with right 10x Vasocompression moderate compression coldest setting 10 min  with elevation  01/08/23: Bike L3 x 5 min Rockerboard x 1 min increases pain in heel Gait: focusing on foot position, step and stride length Heel raises x 10 seated Standing hip ABD and EXT x 20 ea B yellow loop Manual: STM to L plantar fascia x 5 min  PATIENT EDUCATION:  Education details: [email or web address] Person educated: Patient Education method: Explanation, Verbal cues, and Handouts Education comprehension: verbalized understanding  HOME EXERCISE PROGRAM: Access Code: [email or web address] URL: [email or web address] Date: 01/06/2023 Prepared by: Raynelle Fanning  Exercises - Ankle Pumps in Elevation  - 2 x daily - 7 x weekly - 2 sets - 10 reps - Ankle Circles in Elevation  - 2 x daily - 7 x weekly - 2 sets - 10 reps - Supine Active Straight Leg Raise  - 2 x daily - 7 x weekly - 2 sets - 10 reps - Sit to Stand  - 2 x daily - 7 x weekly - 3 sets - 5 reps - Seated Long Arc Quad  - 2 x daily - 7 x weekly - 2 sets - 10 reps - Standing Hip Abduction with Counter  Support  - 2 x daily - 7 x weekly - 2 sets - 10 reps - Standing Hip Extension with Counter Support  - 2 x daily - 7 x weekly - 2 sets - 10 reps - Seated Ankle Eversion with Resistance  - 1 x daily - 3-4 x weekly - 1 sets - 10 reps - Seated Ankle Inversion with Resistance  - 1 x daily - 3-4 x weekly - 1 sets - 10 reps - Heel Raises with Counter Support  - 1 x daily - 3-4 x weekly - 1 sets - 10 reps  ASSESSMENT:  CLINICAL IMPRESSION: Pt had increase in ankle soreness today after being on her feet all day Saturday. She was able to walk for up to 4 minutes despite her increase in pain and PT educated on proper mechanics for heel/toe contact. Pts ankle DF ROM has increased by 10 deg since her evaluation. Ankle ROM still remains significantly limited. Ended with vasopneumatic device for pain and swelling control.     OBJECTIVE IMPAIRMENTS: Abnormal gait, decreased activity tolerance, decreased balance, decreased coordination, decreased mobility, difficulty walking, decreased ROM, decreased strength, hypomobility, increased edema, impaired sensation, and pain.   ACTIVITY LIMITATIONS: standing, squatting, sleeping, stairs, transfers, and locomotion level  PARTICIPATION LIMITATIONS: cleaning, laundry, shopping, and community activity  PERSONAL FACTORS: Time since onset of injury/illness/exacerbation and 1 comorbidity: 3 surgeries to Lt ankle with complications following ORIF  are also affecting patient's functional outcome.   REHAB POTENTIAL: Excellent  CLINICAL DECISION MAKING: Stable/uncomplicated  EVALUATION COMPLEXITY: Low   GOALS: Goals reviewed with patient? Yes  SHORT TERM GOALS:  Target date: 01/15/23 Pt will be ind with initial HEP Baseline: Goal status: met 10/2  2.  Pt will be compliant with edema management using elevation above heart and ice at least 2x/day. Baseline:  Goal status: IN PROGRESS   3.  Pt will improve Lt ankle strength to at least 4/5 Baseline:  Goal status:  INITIAL  4.  Pt will improve TUG test to 11 sec Baseline:  Goal status: MET 10/7  5.  Pt will be able to perform covering 350' with sneaker and lace up brace Baseline:  Goal status: MET 10/7    LONG TERM GOALS: Target date: 02/12/23  Pt will be ind with advanced HEP Baseline:  Goal status: INITIAL  2.  Pt will achieve at least 4+/5 strength throughout Lt LE for gait, transfers, curbs and functional task performance. Baseline:  Goal status: INITIAL  3.  Improve FOTO score to at least 57% to demo improved functional performance. Baseline: 52% Goal status: INITIAL  4.  Pt will be able to participate in covering 750' in sneaker with lace up brace for improved community outings. Baseline:  Goal status: ONGOING  5.  Pt will be able to sleep through night with no or min pain interruption from Lt ankle. Baseline:  Goal status: ONGOING  6.  Pt will report improved Lt ankle pain to no greater than 6/10 with daily tasks. Baseline:  Goal status: MET 10/7   PLAN:  PT FREQUENCY: 2x/week  PT DURATION: 8 weeks  PLANNED INTERVENTIONS: Therapeutic exercises, Therapeutic activity, Neuromuscular re-education, Balance training, Gait training, Patient/Family education, Self Care, Stair training, DME instructions, Aquatic Therapy, Dry Needling, Cryotherapy, Taping, Vasopneumatic device, Ionotophoresis 4mg /ml Dexamethasone, and Manual therapy  PLAN FOR NEXT SESSION: recert; work toward , modify and progress HEP as needed, work on Investment banker, operational with sneaker + lace up brace as able, Lt ankle strength/isometrics, Lt hip and knee strength  7:52 PM,01/28/23 Donita Brooks PT, DPT [facility] [place]  [phone]

## 2023-01-29 ENCOUNTER — Ambulatory Visit: Payer: Medicaid Other | Admitting: Physical Therapy

## 2023-01-29 ENCOUNTER — Encounter: Payer: Self-pay | Admitting: Physical Therapy

## 2023-01-29 DIAGNOSIS — M6281 Muscle weakness (generalized): Secondary | ICD-10-CM

## 2023-01-29 DIAGNOSIS — M25572 Pain in left ankle and joints of left foot: Secondary | ICD-10-CM

## 2023-01-29 DIAGNOSIS — R2689 Other abnormalities of gait and mobility: Secondary | ICD-10-CM

## 2023-01-29 NOTE — Therapy (Signed)
OUTPATIENT PHYSICAL THERAPY LOWER EXTREMITY TREATMENT   Patient Name: Ariana Jarvis MRN: [id number] DOB:1960-09-11, 62 y.o., female Today's Date: 01/29/2023  END OF SESSION:  PT End of Session - 01/29/23 1011     Visit Number 9    Date for PT Re-Evaluation 02/12/23    Authorization Type [facility] Medicaid - requesting 2x/week x 4 weeks through 03/03/23    Authorization Time Period 12/18/22 to 02/16/23    Authorization - Visit Number 8    Authorization - Number of Visits 8    PT Start Time 1015    PT Stop Time 1100    PT Time Calculation (min) 45 min    Activity Tolerance Patient limited by pain;Patient tolerated treatment well    Behavior During Therapy [facility] for tasks assessed/performed                    Past Medical History:  Diagnosis Date   Anxiety    Arthritis    back   Back pain    Chronic back pain    Depression    Gastric ulcer    GERD (gastroesophageal reflux disease)    HLD (hyperlipidemia)    Hypertension    Past Surgical History:  Procedure Laterality Date   ANKLE ARTHROSCOPY WITH ARTHRODESIS Left 09/09/2022   Procedure: LEFT TIBIOTALAR JOINT ARTHRODESIS, DISTAL TIBIOFIBULAR JOINT ARTHRODESIS;  Surgeon: Terance Hart, MD;  Location: [facility] OR;  Service: Orthopedics;  Laterality: Left;  LENGTH OF SURGERY: 150 MINUTES   CESAREAN SECTION     x2   CHOLECYSTECTOMY     COLONOSCOPY     HARDWARE REMOVAL Left 05/16/2022   Procedure: HARDWARE REMOVAL LEFT ANKLE;  Surgeon: Joen Laura, MD;  Location: [facility];  Service: Orthopedics;  Laterality: Left;   ORIF ANKLE FRACTURE Left 03/14/2022   Procedure: OPEN REDUCTION INTERNAL FIXATION (ORIF) ANKLE FRACTURE;  Surgeon: Joen Laura, MD;  Location: [facility];  Service: Orthopedics;  Laterality: Left;   ORIF RADIAL FRACTURE Left 03/14/2022   Procedure: RADIAL HEAD ARTHROPLASTY;  Surgeon: Joen Laura, MD;  Location: [facility];  Service:  Orthopedics;  Laterality: Left;   SYNDESMOSIS REPAIR Left 09/09/2022   Procedure: OPEN REDUCTION OF SYNDESMOSIS;  Surgeon: Terance Hart, MD;  Location: [facility];  Service: Orthopedics;  Laterality: Left;   Patient Active Problem List   Diagnosis Date Noted   Arthritis of left ankle 09/09/2022    PCP: Quitman Livings, MD  REFERRING PROVIDER: Nicki Guadalajara, MD  REFERRING DIAG: s/p Lt ankle arthrodesis of tibiotalar and tibiofibular joints  THERAPY DIAG:  Pain in left ankle and joints of left foot  Muscle weakness (generalized)  Other abnormalities of gait and mobility  Rationale for Evaluation and Treatment: Rehabilitation  ONSET DATE: 09/09/22 surgery date, had traumatic fracture approx 3 mos before surgery  SUBJECTIVE:   SUBJECTIVE STATEMENT: Pt has been more sore this week but maybe b/c she is doing better with her HEP compliance. Wearing a tennis shoe up to 4-5 hours a day before it swells too much to keep wearing.  Waking up every 3 hours from Lt ankle pain and needs pain meds.  Goes by Ariana Jarvis   Pt is just over 3 mos s/p Lt tibiotalar and tibiofibular joint arthrodesis performed by Nicki Guadalajara, MD on 09/09/22.  She suffered a Lt ankle fracture with ORIF 03/14/22 and hardware removal from this in Feb 2024.  However, she had a post-traumatic collapse and avascular necrosis,  necessitating subsequent surgery. Pt arrives wearing a walking boot on Lt foot but has been cleared to WB through Lt LE as tolerated.  She has a lace up/velcro brace to wear as needed.   At home I walk on it without the brace.  I wear slippers a lot at home b/c my foot and ankle are too big to put my foot in.  My foot and ankle can swell a lot.  I have numbness along the sides.  I will elevated it throughout the day after walking a lot to help the swelling.  PERTINENT HISTORY: HTN, HLD, back arthritis PAIN:  PAIN:  Are you having pain? Yes NPRS scale: 5 or 6/10 Pain location: Lt lateral and medial  ankle Pain orientation: Left  PAIN TYPE: aching, dull, sharp, throbbing, and tight Pain description: constant  Aggravating factors: standing and walking Relieving factors: pain meds, rest/elevation, using walking boot   PRECAUTIONS: Fall  RED FLAGS: None   WEIGHT BEARING RESTRICTIONS:  cleared to WB through Lt LE as tolerated  FALLS:  Has patient fallen in last 6 months? Yes. Number of falls knee scooter tipped 1x  LIVING ENVIRONMENT: Lives with: lives with their family and lives alone Lives in: House/apartment Stairs: No Has following equipment at home:  walking boot for Lt foot as needed, has a wheelchair if needs to walk very far  OCCUPATION: unemployed  PLOF: Independent  PATIENT GOALS: get stronger, feel more confident and less pain walking with shoe on vs boot  NEXT MD VISIT: wanted to see her in 6 weeks  OBJECTIVE:   DIAGNOSTIC FINDINGS:   PATIENT SURVEYS:  FOTO: 63% MET GOAL FOTO 52% goal 57%  COGNITION: Overall cognitive status: Within functional limits for tasks assessed     SENSATION: Numbness along sides of Lt ankle since surgery medial> lateral  EDEMA:  10/17: Figure 8: Rt 54.5cm, Lt 58.5cm  Figure 8: Lt 55cm, Rt 51cm  MUSCLE LENGTH: Lt Gastroc limited 50% WNL hips and knees bil  POSTURE: No Significant postural limitations  PALPATION: Both tender and numb along medial and lateral aspects of ankle and foot  LOWER EXTREMITY ROM:  01/27/23: Lt 10 DF, 12 PF  Eval: Lt ankle 0 deg DF, 41 deg PF Rt ankle 12 deg DF, 55 deg PF   LOWER EXTREMITY MMT: 10/17: Lt knee 5/5, Lt ankle 4/5  Bil hips 5/5  Rt knee 5/5 Lt knee 4/5  Rt ankle 5/5  Lt ankle 3+/5 all cardinal planes of motion    FUNCTIONAL TESTS:  10/17: 5x STS: 9.96 TUG: 9.62 3 MWT: 599'  10/7:  5x STS: 10.41 TUG: 9.92 3 min walk test: 563'  Eval: 5 times sit to stand: 15.13 hands on thighs Timed up and go (TUG): 12.95 wearing walking boot 2 minute walk test: 11'  with Lt walking boot  GAIT: Distance walked: 312' Assistive device utilized:  wearing Lt walking boot Level of assistance: Modified independence Comments: some lateral ankle weakness within boot   TODAY'S TREATMENT:  DATE:  01/29/23: MMT, 5X STS, TUG, (see measurements above) Seated on elevated mat table with feet dangling: gentle mob with movement Lt ankle DF while swinging knee into flexion Supine in SAQ positioning over bolster: Lt ankle A/ROM 2x10 PF/DF, Inv/Ever STM to Lt plantar fascia, ankle tendons anterior and lateral to reduce post-exercise soreness and retrograde massage to posterior/lateral ankle for edema reduction Ice pack Lt ankle x8'   01/27/23 Walking x56min: PT cues to focus on proper push off and decrease foot eversion Seated Lt heel raise, 8# on thigh x20 Seated DF x20 Lt ankle slides into PF/DF 2x10 Vaso end of session x10 min, medium compression, coldest temp Manual:  AP talocrural mobs grade III-IV Mid foot stretches and mobilization grade IV Proximal fibular mobs AP grade IV   01/19/23: NuStep L3 x 6' (blue machine) PT present to review status  Seated Lt ankle A/ROM x10 each: circles CW/CCW, DF/PF, inv/ever Lt ankle rockerboard x2' Seated heel raise with 8# resting on left knee 2x10 Blue tband ankle inversion, eversion, PF 2x10 each TUG, 5X STS, , FOTO 4" step up fwd and lateral x10 each, Lt LE Lt SLS with single rail clock taps Rt LE 2 rounds Standing at the bottom of the steps: left foot on 2nd step rocking 10x Vasocompression moderate compression coldest setting 10 min  with elevation   PATIENT EDUCATION:  Education details: [email or web address] Person educated: Patient Education method: Programmer, multimedia, Verbal cues, and Handouts Education comprehension: verbalized understanding  HOME EXERCISE PROGRAM: Access Code:  [email or web address] URL: [email or web address] Date: 01/06/2023 Prepared by: Raynelle Fanning  Exercises - Ankle Pumps in Elevation  - 2 x daily - 7 x weekly - 2 sets - 10 reps - Ankle Circles in Elevation  - 2 x daily - 7 x weekly - 2 sets - 10 reps - Supine Active Straight Leg Raise  - 2 x daily - 7 x weekly - 2 sets - 10 reps - Sit to Stand  - 2 x daily - 7 x weekly - 3 sets - 5 reps - Seated Long Arc Quad  - 2 x daily - 7 x weekly - 2 sets - 10 reps - Standing Hip Abduction with Counter Support  - 2 x daily - 7 x weekly - 2 sets - 10 reps - Standing Hip Extension with Counter Support  - 2 x daily - 7 x weekly - 2 sets - 10 reps - Seated Ankle Eversion with Resistance  - 1 x daily - 3-4 x weekly - 1 sets - 10 reps - Seated Ankle Inversion with Resistance  - 1 x daily - 3-4 x weekly - 1 sets - 10 reps - Heel Raises with Counter Support  - 1 x daily - 3-4 x weekly - 1 sets - 10 reps  ASSESSMENT:  CLINICAL IMPRESSION: Pt progressing toward her goals.  Lt ankle strength is now 4/5 and Lt knee is 5/5.  She does report having more pain this week as she is increasing her activity level and HEP compliance.  She is wearing a tennis shoe up to 5 hours/day.  FOTO score is improved to 63%.  Pt is walking up to 4' at a time and today improved her distance to 599 feet.  She did present with slightly more antalgia today due to increased Lt ankle soreness across anterior and lateral ankle.  She presented with 3cm of edema difference Lt to Rt ankle (fig 8 measurement) with edema noted mostly along posterolateral ankle.  Pt  has been elevating foot but not icing due to cold intolerance.  Sleep continues to be disrupted by pain after about 3 hours of sleep.  She continues to need pain meds for pain control.  Manual therapy provided today to reduce soreness and edema.  Pt will continue to benefit from PT to maximize function and strength.      OBJECTIVE IMPAIRMENTS: Abnormal gait, decreased activity tolerance,  decreased balance, decreased coordination, decreased mobility, difficulty walking, decreased ROM, decreased strength, hypomobility, increased edema, impaired sensation, and pain.   ACTIVITY LIMITATIONS: standing, squatting, sleeping, stairs, transfers, and locomotion level  PARTICIPATION LIMITATIONS: cleaning, laundry, shopping, and community activity  PERSONAL FACTORS: Time since onset of injury/illness/exacerbation and 1 comorbidity: 3 surgeries to Lt ankle with complications following ORIF  are also affecting patient's functional outcome.   REHAB POTENTIAL: Excellent  CLINICAL DECISION MAKING: Stable/uncomplicated  EVALUATION COMPLEXITY: Low   GOALS: Goals reviewed with patient? Yes  SHORT TERM GOALS: Target date: 01/15/23 Pt will be ind with initial HEP Baseline: Goal status: met 10/2  2.  Pt will be compliant with edema management using elevation above heart and ice at least 2x/day. Baseline:  Goal status: IN PROGRESS   3.  Pt will improve Lt ankle strength to at least 4/5 Baseline:  Goal status: INITIAL  4.  Pt will improve TUG test to 11 sec Baseline:  Goal status: MET 10/7  5.  Pt will be able to perform covering 350' with sneaker and lace up brace Baseline:  Goal status: MET 10/7    LONG TERM GOALS: Target date: 02/12/23  Pt will be ind with advanced HEP Baseline:  Goal status: INITIAL  2.  Pt will achieve at least 4+/5 strength throughout Lt LE for gait, transfers, curbs and functional task performance. Baseline:  Goal status: INITIAL  3.  Improve FOTO score to at least 57% to demo improved functional performance. Baseline: 52%, 63% Goal status: MET 10/7  4.  Pt will be able to participate in covering 750' in sneaker with lace up brace for improved community outings. Baseline:  Goal status: ONGOING 10/17 - covering 599', has tolerated up to 4' walk test  5.  Pt will be able to sleep through night with no or min pain interruption from  Lt ankle. Baseline:  Goal status: ONGOING 10/17  6.  Pt will report improved Lt ankle pain to no greater than 6/10 with daily tasks. Baseline:  Goal status: MET 10/7   PLAN:  PT FREQUENCY: 2x/week  PT DURATION: 8 weeks  PLANNED INTERVENTIONS: Therapeutic exercises, Therapeutic activity, Neuromuscular re-education, Balance training, Gait training, Patient/Family education, Self Care, Stair training, DME instructions, Aquatic Therapy, Dry Needling, Cryotherapy, Taping, Vasopneumatic device, Ionotophoresis 4mg /ml Dexamethasone, and Manual therapy  PLAN FOR NEXT SESSION: f/u on new auth request sent last time, ERO by 10/31, work toward , work on Hydrographic surveyor for [employer], modify and progress HEP as needed, work on Investment banker, operational with sneaker + lace up brace as able, Lt ankle strength/isometrics, Lt hip and knee strength  Jaymarion Trombly, PT 01/29/23 1:09 PM  [facility]  [phone]

## 2023-02-02 IMAGING — MG MM DIGITAL SCREENING BILAT W/ TOMO AND CAD
6 of 12 series · 6 of 36 positions shown · non-contrast
Comparison: Previous exam(s).

CLINICAL DATA: Screening.

EXAM:
DIGITAL SCREENING BILATERAL MAMMOGRAM WITH TOMOSYNTHESIS AND CAD
TECHNIQUE: Bilateral screening digital craniocaudal and mediolateral oblique
mammograms were obtained. Bilateral screening digital breast
tomosynthesis was performed. The images were evaluated with
computer-aided detection.

[L MLO synth-2D (1 of 2)]
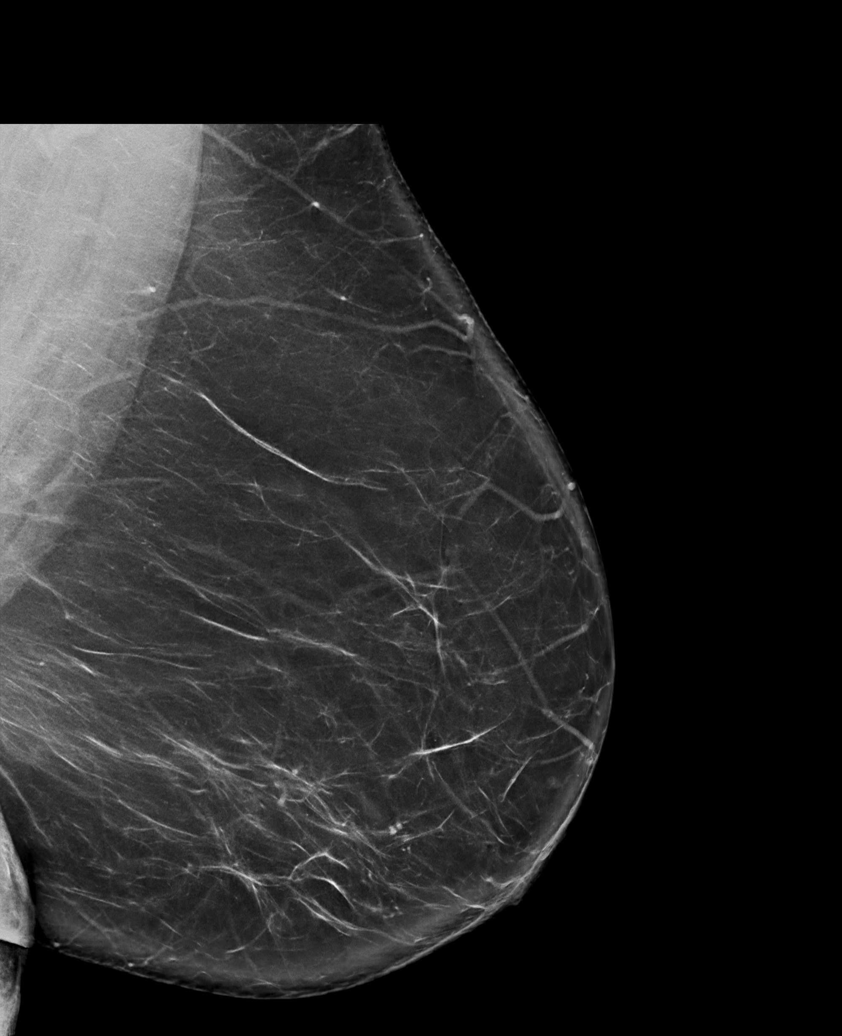

[L CC synth-2D (1 of 2)]
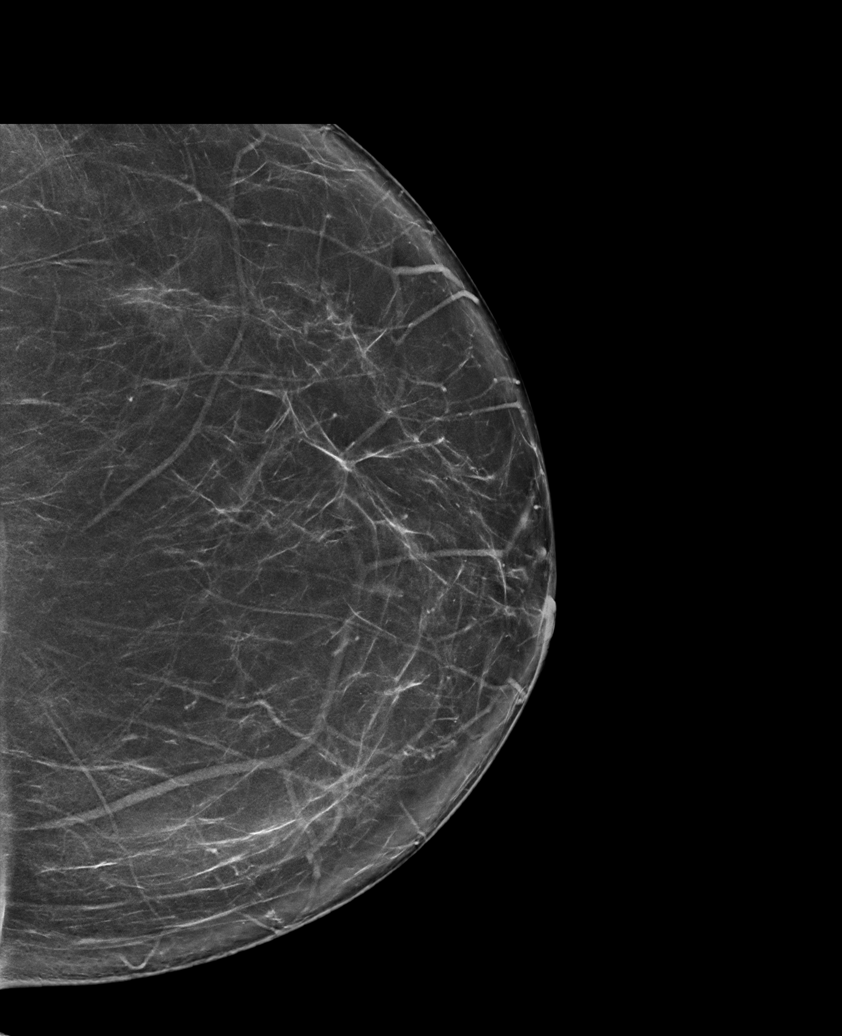

[L MLO synth-2D (2 of 2)]
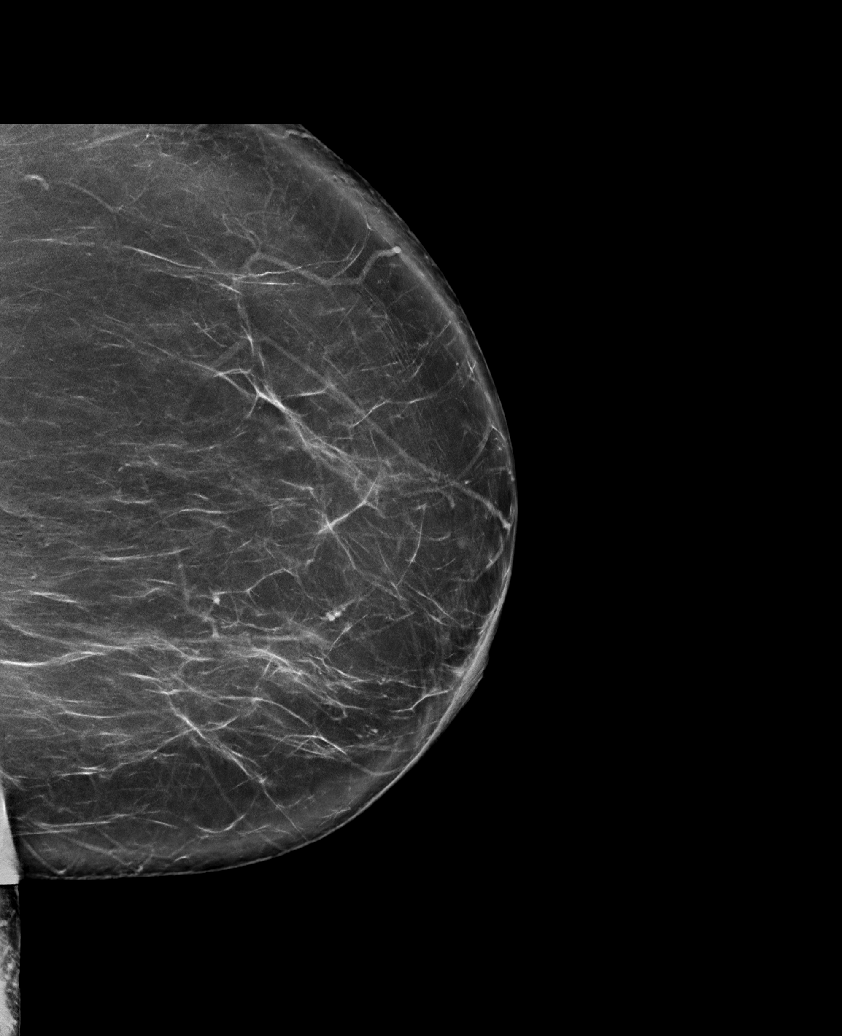

[R CC synth-2D]
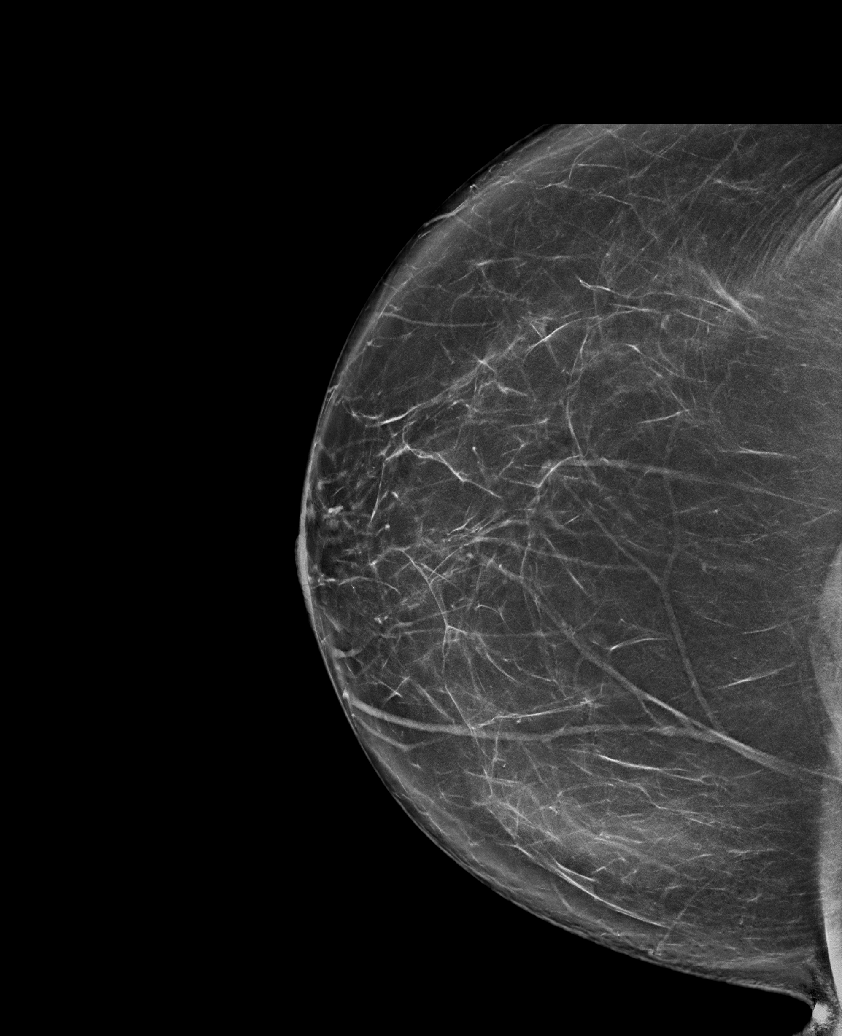

[L CC synth-2D (2 of 2)]
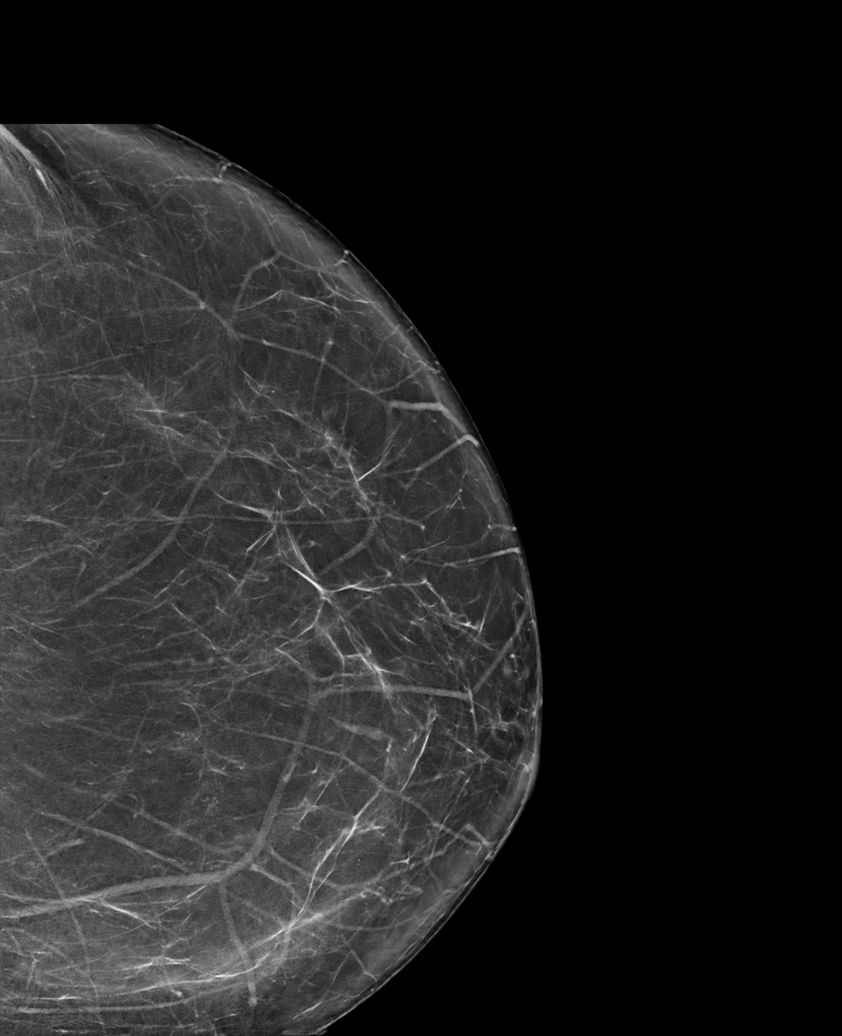

[R MLO synth-2D]
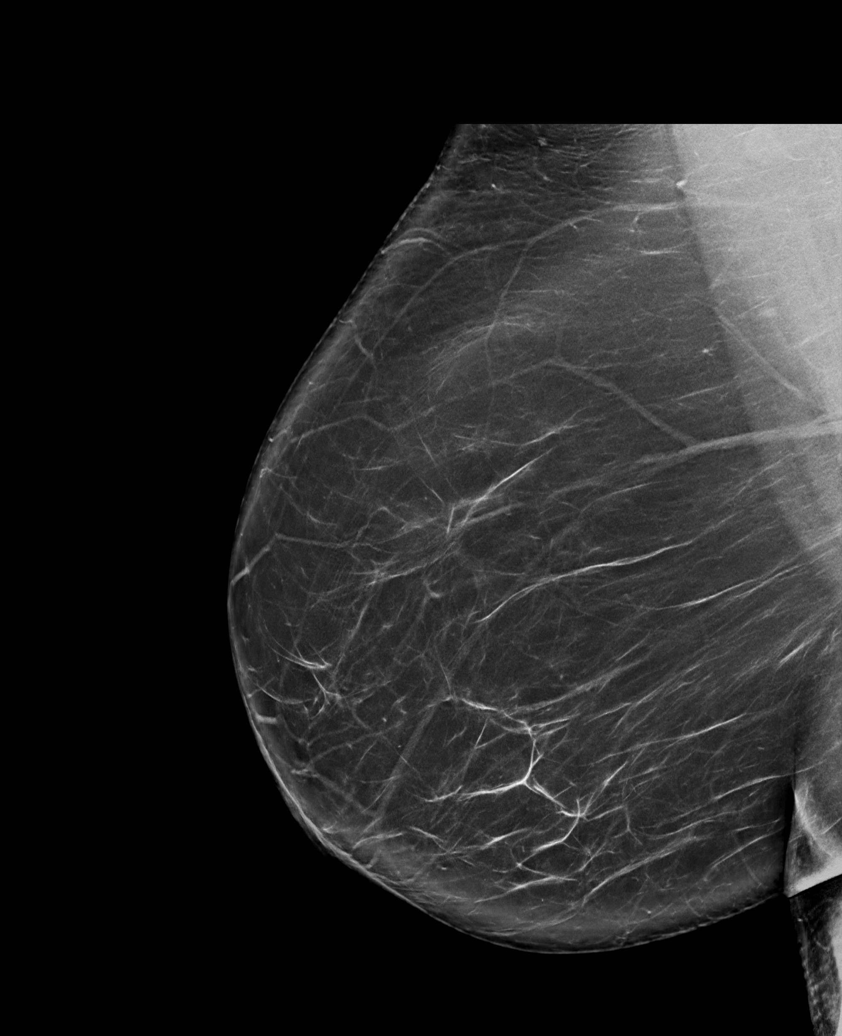

[6 of 36 positions shown; findings below may reference images not displayed]

ACR Breast Density Category b: There are scattered areas of
fibroglandular density.
FINDINGS: There are no findings suspicious for malignancy.
IMPRESSION: No mammographic evidence of malignancy. A result letter of this
screening mammogram will be mailed directly to the patient.

RECOMMENDATION:
Screening mammogram in one year. (Code:51-O-LD2)

BI-RADS CATEGORY  1: Negative.

## 2023-02-02 NOTE — Therapy (Signed)
OUTPATIENT PHYSICAL THERAPY LOWER EXTREMITY TREATMENT   Patient Name: Ariana Jarvis MRN: [id number] DOB:20-Dec-1960, 62 y.o., female Today's Date: 02/02/2023  END OF SESSION:           Past Medical History:  Diagnosis Date   Anxiety    Arthritis    back   Back pain    Chronic back pain    Depression    Gastric ulcer    GERD (gastroesophageal reflux disease)    HLD (hyperlipidemia)    Hypertension    Past Surgical History:  Procedure Laterality Date   ANKLE ARTHROSCOPY WITH ARTHRODESIS Left 09/09/2022   Procedure: LEFT TIBIOTALAR JOINT ARTHRODESIS, DISTAL TIBIOFIBULAR JOINT ARTHRODESIS;  Surgeon: Terance Hart, MD;  Location: [facility] OR;  Service: Orthopedics;  Laterality: Left;  LENGTH OF SURGERY: 150 MINUTES   CESAREAN SECTION     x2   CHOLECYSTECTOMY     COLONOSCOPY     HARDWARE REMOVAL Left 05/16/2022   Procedure: HARDWARE REMOVAL LEFT ANKLE;  Surgeon: Joen Laura, MD;  Location: [facility];  Service: Orthopedics;  Laterality: Left;   ORIF ANKLE FRACTURE Left 03/14/2022   Procedure: OPEN REDUCTION INTERNAL FIXATION (ORIF) ANKLE FRACTURE;  Surgeon: Joen Laura, MD;  Location: [facility];  Service: Orthopedics;  Laterality: Left;   ORIF RADIAL FRACTURE Left 03/14/2022   Procedure: RADIAL HEAD ARTHROPLASTY;  Surgeon: Joen Laura, MD;  Location:  SURGERY CENTER;  Service: Orthopedics;  Laterality: Left;   SYNDESMOSIS REPAIR Left 09/09/2022   Procedure: OPEN REDUCTION OF SYNDESMOSIS;  Surgeon: Terance Hart, MD;  Location: [facility];  Service: Orthopedics;  Laterality: Left;   Patient Active Problem List   Diagnosis Date Noted   Arthritis of left ankle 09/09/2022    PCP: Quitman Livings, MD  REFERRING PROVIDER: Nicki Guadalajara, MD  REFERRING DIAG: s/p Lt ankle arthrodesis of tibiotalar and tibiofibular joints  THERAPY DIAG:  No diagnosis found.  Rationale for Evaluation and Treatment:  Rehabilitation  ONSET DATE: 09/09/22 surgery date, had traumatic fracture approx 3 mos before surgery  SUBJECTIVE:   SUBJECTIVE STATEMENT: ***  Goes by Ariana Jarvis   Pt is just over 3 mos s/p Lt tibiotalar and tibiofibular joint arthrodesis performed by Nicki Guadalajara, MD on 09/09/22.  She suffered a Lt ankle fracture with ORIF 03/14/22 and hardware removal from this in Feb 2024.  However, she had a post-traumatic collapse and avascular necrosis, necessitating subsequent surgery. Pt arrives wearing a walking boot on Lt foot but has been cleared to WB through Lt LE as tolerated.  She has a lace up/velcro brace to wear as needed.   At home I walk on it without the brace.  I wear slippers a lot at home b/c my foot and ankle are too big to put my foot in.  My foot and ankle can swell a lot.  I have numbness along the sides.  I will elevated it throughout the day after walking a lot to help the swelling.  PERTINENT HISTORY: HTN, HLD, back arthritis PAIN:  PAIN:  Are you having pain? Yes NPRS scale: 5 or 6/10 Pain location: Lt lateral and medial ankle Pain orientation: Left  PAIN TYPE: aching, dull, sharp, throbbing, and tight Pain description: constant  Aggravating factors: standing and walking Relieving factors: pain meds, rest/elevation, using walking boot   PRECAUTIONS: Fall  RED FLAGS: None   WEIGHT BEARING RESTRICTIONS:  cleared to WB through Lt LE as tolerated  FALLS:  Has patient fallen in  last 6 months? Yes. Number of falls knee scooter tipped 1x  LIVING ENVIRONMENT: Lives with: lives with their family and lives alone Lives in: House/apartment Stairs: No Has following equipment at home:  walking boot for Lt foot as needed, has a wheelchair if needs to walk very far  OCCUPATION: unemployed  PLOF: Independent  PATIENT GOALS: get stronger, feel more confident and less pain walking with shoe on vs boot  NEXT MD VISIT: wanted to see her in 6 weeks  OBJECTIVE:   DIAGNOSTIC  FINDINGS:   PATIENT SURVEYS:  FOTO: 63% MET GOAL FOTO 52% goal 57%  COGNITION: Overall cognitive status: Within functional limits for tasks assessed     SENSATION: Numbness along sides of Lt ankle since surgery medial> lateral  EDEMA:  10/17: Figure 8: Rt 54.5cm, Lt 58.5cm  Figure 8: Lt 55cm, Rt 51cm  MUSCLE LENGTH: Lt Gastroc limited 50% WNL hips and knees bil  POSTURE: No Significant postural limitations  PALPATION: Both tender and numb along medial and lateral aspects of ankle and foot  LOWER EXTREMITY ROM:  01/27/23: Lt 10 DF, 12 PF  Eval: Lt ankle 0 deg DF, 41 deg PF Rt ankle 12 deg DF, 55 deg PF   LOWER EXTREMITY MMT: 10/17: Lt knee 5/5, Lt ankle 4/5  Bil hips 5/5  Rt knee 5/5 Lt knee 4/5  Rt ankle 5/5  Lt ankle 3+/5 all cardinal planes of motion    FUNCTIONAL TESTS:  10/17: 5x STS: 9.96 TUG: 9.62 3 MWT: 599'  10/7:  5x STS: 10.41 TUG: 9.92 3 min walk test: 563'  Eval: 5 times sit to stand: 15.13 hands on thighs Timed up and go (TUG): 12.95 wearing walking boot 2 minute walk test: 81' with Lt walking boot  GAIT: Distance walked: 312' Assistive device utilized:  wearing Lt walking boot Level of assistance: Modified independence Comments: some lateral ankle weakness within boot   TODAY'S TREATMENT:                                                                                                                              DATE:  02/03/23 Walking x77min: PT cues to focus on proper push off and decrease foot eversion Seated Lt heel raise, 8# on thigh x20 Seated DF x20 Lt ankle slides into PF/DF 2x10 Supine in SAQ positioning over bolster: Lt ankle A/ROM 2x10 PF/DF, Inv/Ever STM to Lt plantar fascia, ankle tendons anterior and lateral to reduce post-exercise soreness and retrograde massage to posterior/lateral ankle for edema reduction Ice pack Lt ankle x8'  01/29/23: MMT, 5X STS, TUG, (see measurements above) Seated on elevated  mat table with feet dangling: gentle mob with movement Lt ankle DF while swinging knee into flexion Supine in SAQ positioning over bolster: Lt ankle A/ROM 2x10 PF/DF, Inv/Ever STM to Lt plantar fascia, ankle tendons anterior and lateral to reduce post-exercise soreness and retrograde massage to posterior/lateral ankle for edema reduction Ice pack Lt ankle  x8'   01/27/23 Walking x42min: PT cues to focus on proper push off and decrease foot eversion Seated Lt heel raise, 8# on thigh x20 Seated DF x20 Lt ankle slides into PF/DF 2x10 Vaso end of session x10 min, medium compression, coldest temp Manual:  AP talocrural mobs grade III-IV Mid foot stretches and mobilization grade IV Proximal fibular mobs AP grade IV   01/19/23: NuStep L3 x 6' (blue machine) PT present to review status  Seated Lt ankle A/ROM x10 each: circles CW/CCW, DF/PF, inv/ever Lt ankle rockerboard x2' Seated heel raise with 8# resting on left knee 2x10 Blue tband ankle inversion, eversion, PF 2x10 each TUG, 5X STS, , FOTO 4" step up fwd and lateral x10 each, Lt LE Lt SLS with single rail clock taps Rt LE 2 rounds Standing at the bottom of the steps: left foot on 2nd step rocking 10x Vasocompression moderate compression coldest setting 10 min  with elevation   PATIENT EDUCATION:  Education details: [email or web address] Person educated: Patient Education method: Programmer, multimedia, Verbal cues, and Handouts Education comprehension: verbalized understanding  HOME EXERCISE PROGRAM: Access Code: [email or web address] URL: [email or web address] Date: 01/06/2023 Prepared by: Raynelle Fanning  Exercises - Ankle Pumps in Elevation  - 2 x daily - 7 x weekly - 2 sets - 10 reps - Ankle Circles in Elevation  - 2 x daily - 7 x weekly - 2 sets - 10 reps - Supine Active Straight Leg Raise  - 2 x daily - 7 x weekly - 2 sets - 10 reps - Sit to Stand  - 2 x daily - 7 x weekly - 3 sets - 5 reps - Seated Long Arc Quad  - 2 x daily - 7 x weekly - 2 sets -  10 reps - Standing Hip Abduction with Counter Support  - 2 x daily - 7 x weekly - 2 sets - 10 reps - Standing Hip Extension with Counter Support  - 2 x daily - 7 x weekly - 2 sets - 10 reps - Seated Ankle Eversion with Resistance  - 1 x daily - 3-4 x weekly - 1 sets - 10 reps - Seated Ankle Inversion with Resistance  - 1 x daily - 3-4 x weekly - 1 sets - 10 reps - Heel Raises with Counter Support  - 1 x daily - 3-4 x weekly - 1 sets - 10 reps  ASSESSMENT:  CLINICAL IMPRESSION: ***    OBJECTIVE IMPAIRMENTS: Abnormal gait, decreased activity tolerance, decreased balance, decreased coordination, decreased mobility, difficulty walking, decreased ROM, decreased strength, hypomobility, increased edema, impaired sensation, and pain.   ACTIVITY LIMITATIONS: standing, squatting, sleeping, stairs, transfers, and locomotion level  PARTICIPATION LIMITATIONS: cleaning, laundry, shopping, and community activity  PERSONAL FACTORS: Time since onset of injury/illness/exacerbation and 1 comorbidity: 3 surgeries to Lt ankle with complications following ORIF  are also affecting patient's functional outcome.   REHAB POTENTIAL: Excellent  CLINICAL DECISION MAKING: Stable/uncomplicated  EVALUATION COMPLEXITY: Low   GOALS: Goals reviewed with patient? Yes  SHORT TERM GOALS: Target date: 01/15/23 Pt will be ind with initial HEP Baseline: Goal status: met 10/2  2.  Pt will be compliant with edema management using elevation above heart and ice at least 2x/day. Baseline:  Goal status: IN PROGRESS   3.  Pt will improve Lt ankle strength to at least 4/5 Baseline:  Goal status: INITIAL  4.  Pt will improve TUG test to 11 sec Baseline:  Goal status: MET 10/7  5.  Pt will be able  to perform covering 90' with sneaker and lace up brace Baseline:  Goal status: MET 10/7    LONG TERM GOALS: Target date: 02/12/23  Pt will be ind with advanced HEP Baseline:  Goal status: INITIAL  2.  Pt  will achieve at least 4+/5 strength throughout Lt LE for gait, transfers, curbs and functional task performance. Baseline:  Goal status: INITIAL  3.  Improve FOTO score to at least 57% to demo improved functional performance. Baseline: 52%, 63% Goal status: MET 10/7  4.  Pt will be able to participate in covering 750' in sneaker with lace up brace for improved community outings. Baseline:  Goal status: ONGOING 10/17 - covering 599', has tolerated up to 4' walk test  5.  Pt will be able to sleep through night with no or min pain interruption from Lt ankle. Baseline:  Goal status: ONGOING 10/17  6.  Pt will report improved Lt ankle pain to no greater than 6/10 with daily tasks. Baseline:  Goal status: MET 10/7   PLAN:  PT FREQUENCY: 2x/week  PT DURATION: 8 weeks  PLANNED INTERVENTIONS: Therapeutic exercises, Therapeutic activity, Neuromuscular re-education, Balance training, Gait training, Patient/Family education, Self Care, Stair training, DME instructions, Aquatic Therapy, Dry Needling, Cryotherapy, Taping, Vasopneumatic device, Ionotophoresis 4mg /ml Dexamethasone, and Manual therapy  PLAN FOR NEXT SESSION: f/u on new auth request sent last time, ERO by 10/31, work toward , work on Hydrographic surveyor for [employer], modify and progress HEP as needed, work on Investment banker, operational with sneaker + lace up brace as able, Lt ankle strength/isometrics, Lt hip and knee strength  Solon Palm, PT  02/02/23 8:09 PM   [facility]  [phone]

## 2023-02-03 ENCOUNTER — Ambulatory Visit: Payer: Medicaid Other | Admitting: Physical Therapy

## 2023-02-03 ENCOUNTER — Encounter: Payer: Self-pay | Admitting: Physical Therapy

## 2023-02-03 DIAGNOSIS — R2689 Other abnormalities of gait and mobility: Secondary | ICD-10-CM

## 2023-02-03 DIAGNOSIS — M25572 Pain in left ankle and joints of left foot: Secondary | ICD-10-CM

## 2023-02-03 DIAGNOSIS — M6281 Muscle weakness (generalized): Secondary | ICD-10-CM

## 2023-02-04 NOTE — Therapy (Signed)
OUTPATIENT PHYSICAL THERAPY LOWER EXTREMITY TREATMENT    Patient Name: Ariana Jarvis MRN: [id number] DOB:1960/04/24, 62 y.o., female Today's Date: 02/05/2023  END OF SESSION:  PT End of Session - 02/05/23 1024     Visit Number 11    Number of Visits 13    Date for PT Re-Evaluation 02/12/23    Authorization Type [employer] - 4 visits approved through 1/3    Authorization Time Period 11/5 -1/3    Authorization - Visit Number 10    PT Start Time 1025    PT Stop Time 1117    PT Time Calculation (min) 52 min    Activity Tolerance Patient limited by pain;Patient tolerated treatment well    Behavior During Therapy [facility] for tasks assessed/performed                      Past Medical History:  Diagnosis Date   Anxiety    Arthritis    back   Back pain    Chronic back pain    Depression    Gastric ulcer    GERD (gastroesophageal reflux disease)    HLD (hyperlipidemia)    Hypertension    Past Surgical History:  Procedure Laterality Date   ANKLE ARTHROSCOPY WITH ARTHRODESIS Left 09/09/2022   Procedure: LEFT TIBIOTALAR JOINT ARTHRODESIS, DISTAL TIBIOFIBULAR JOINT ARTHRODESIS;  Surgeon: Terance Hart, MD;  Location: [facility] OR;  Service: Orthopedics;  Laterality: Left;  LENGTH OF SURGERY: 150 MINUTES   CESAREAN SECTION     x2   CHOLECYSTECTOMY     COLONOSCOPY     HARDWARE REMOVAL Left 05/16/2022   Procedure: HARDWARE REMOVAL LEFT ANKLE;  Surgeon: Joen Laura, MD;  Location: [facility];  Service: Orthopedics;  Laterality: Left;   ORIF ANKLE FRACTURE Left 03/14/2022   Procedure: OPEN REDUCTION INTERNAL FIXATION (ORIF) ANKLE FRACTURE;  Surgeon: Joen Laura, MD;  Location: [facility];  Service: Orthopedics;  Laterality: Left;   ORIF RADIAL FRACTURE Left 03/14/2022   Procedure: RADIAL HEAD ARTHROPLASTY;  Surgeon: Joen Laura, MD;  Location:  SURGERY CENTER;  Service: Orthopedics;  Laterality: Left;    SYNDESMOSIS REPAIR Left 09/09/2022   Procedure: OPEN REDUCTION OF SYNDESMOSIS;  Surgeon: Terance Hart, MD;  Location: [facility] OR;  Service: Orthopedics;  Laterality: Left;   Patient Active Problem List   Diagnosis Date Noted   Arthritis of left ankle 09/09/2022    PCP: Quitman Livings, MD  REFERRING PROVIDER: Nicki Guadalajara, MD  REFERRING DIAG: s/p Lt ankle arthrodesis of tibiotalar and tibiofibular joints  THERAPY DIAG:  Pain in left ankle and joints of left foot  Muscle weakness (generalized)  Other abnormalities of gait and mobility  Rationale for Evaluation and Treatment: Rehabilitation  ONSET DATE: 09/09/22 surgery date, had traumatic fracture approx 3 mos before surgery  SUBJECTIVE:   SUBJECTIVE STATEMENT: Dr. Fredna Dow everything looks good.  Goes by Ariana Jarvis   Pt is just over 3 mos s/p Lt tibiotalar and tibiofibular joint arthrodesis performed by Nicki Guadalajara, MD on 09/09/22.  She suffered a Lt ankle fracture with ORIF 03/14/22 and hardware removal from this in Feb 2024.  However, she had a post-traumatic collapse and avascular necrosis, necessitating subsequent surgery. Pt arrives wearing a walking boot on Lt foot but has been cleared to WB through Lt LE as tolerated.  She has a lace up/velcro brace to wear as needed.   At home I walk on it without the brace.  I  wear slippers a lot at home b/c my foot and ankle are too big to put my foot in.  My foot and ankle can swell a lot.  I have numbness along the sides.  I will elevated it throughout the day after walking a lot to help the swelling.  PERTINENT HISTORY: HTN, HLD, back arthritis PAIN:  PAIN:  Are you having pain? Yes NPRS scale: 4/10 Pain location: Lt lateral and medial ankle Pain orientation: Left  PAIN TYPE: aching, dull, sharp, throbbing, and tight Pain description: constant  Aggravating factors: standing and walking Relieving factors: pain meds, rest/elevation, using walking boot   PRECAUTIONS: Fall  RED  FLAGS: None   WEIGHT BEARING RESTRICTIONS:  cleared to WB through Lt LE as tolerated  FALLS:  Has patient fallen in last 6 months? Yes. Number of falls knee scooter tipped 1x  LIVING ENVIRONMENT: Lives with: lives with their family and lives alone Lives in: House/apartment Stairs: No Has following equipment at home:  walking boot for Lt foot as needed, has a wheelchair if needs to walk very far  OCCUPATION: unemployed  PLOF: Independent  PATIENT GOALS: get stronger, feel more confident and less pain walking with shoe on vs boot  NEXT MD VISIT: wanted to see her in 6 weeks  OBJECTIVE:   DIAGNOSTIC FINDINGS:   PATIENT SURVEYS:  FOTO: 63% MET GOAL FOTO 52% goal 57%  COGNITION: Overall cognitive status: Within functional limits for tasks assessed     SENSATION: Numbness along sides of Lt ankle since surgery medial> lateral  EDEMA:  10/17: Figure 8: Rt 54.5cm, Lt 58.5cm  Figure 8: Lt 55cm, Rt 51cm  MUSCLE LENGTH: Lt Gastroc limited 50% WNL hips and knees bil  POSTURE: No Significant postural limitations  PALPATION: Both tender and numb along medial and lateral aspects of ankle and foot  LOWER EXTREMITY ROM:  02/03/23 A/P ROM L ankle   PF 53/63 DF -6/0 Inv 25/34  pain Ever 30/24  01/27/23: Lt 10 DF, 12 PF  Eval: Lt ankle 0 deg DF, 41 deg PF Rt ankle 12 deg DF, 55 deg PF   LOWER EXTREMITY MMT: 10/22  DF 5/5, Ever and Inv 4+/5, PF 4-/5 (tested in sitting) painful  10/17: Lt knee 5/5, Lt ankle 4/5   Bil hips 5/5  Rt knee 5/5 Lt knee 4/5  Rt ankle 5/5  Lt ankle 3+/5 all cardinal planes of motion    FUNCTIONAL TESTS:  10/22: (stopped at 5 min and 10 sec due to pain) 827 ft.  10/17: 5x STS: 9.96 TUG: 9.62 3 MWT: 599'  10/7:  5x STS: 10.41 TUG: 9.92 3 min walk test: 563'  Eval: 5 times sit to stand: 15.13 hands on thighs Timed up and go (TUG): 12.95 wearing walking boot 2 minute walk test: 3' with Lt walking  boot  GAIT: Distance walked: 312' Assistive device utilized:  wearing Lt walking boot Level of assistance: Modified independence Comments: some lateral ankle weakness within boot  10/22: decreased stance time left due to decreased DF, increased hip ER    TODAY'S TREATMENT:  DATE:  02/05/23 Walking x62min - stop due to hip pain and some ankle pain  Seated fig 4 2x30 sec Curb negotiation outside - up with left down with R 4 inch step ups fwd x 10, lateral x 10; 6 inch lateral x 10 Seated Lt heel raise, 10# on thigh x20 Seated DF x20 Seated DF and eversion with band yellow x 20 Seated PF Red Tband x 20 Lt ankle slides into PF/DF 2x10 Step downs 2 inch x 10 Manual: manual resorption for edema, PROM all planes Ice x 10 min to L ankle at end of session  02/03/23 Walking x 5 min 10 Sec 827 ft: PT cues to focus on proper push off and decrease foot eversion  and hip ER - pain increased to 6/10 Goals assessed, MMT, ROM for MD note Manual: STM to Lt gastoc/soleus, ankle tendons anterior and lateral to reduce soreness in addition to scar massage; retrograde massage to posterior/lateral ankle for edema reduction Cuboid mobs, mid and forefoot mobs, talocrural and calcaneal mobs and PROM of left ankle in all directions  01/29/23: MMT, 5X STS, TUG, (see measurements above) Seated on elevated mat table with feet dangling: gentle mob with movement Lt ankle DF while swinging knee into flexion Supine in SAQ positioning over bolster: Lt ankle A/ROM 2x10 PF/DF, Inv/Ever STM to Lt plantar fascia, ankle tendons anterior and lateral to reduce post-exercise soreness and retrograde massage to posterior/lateral ankle for edema reduction Ice pack Lt ankle x8'   01/27/23 Walking x29min: PT cues to focus on proper push off and decrease foot eversion Seated Lt heel raise, 8# on  thigh x20 Seated DF x20 Lt ankle slides into PF/DF 2x10 Vaso end of session x10 min, medium compression, coldest temp Manual:  AP talocrural mobs grade III-IV Mid foot stretches and mobilization grade IV Proximal fibular mobs AP grade IV   01/19/23: NuStep L3 x 6' (blue machine) PT present to review status  Seated Lt ankle A/ROM x10 each: circles CW/CCW, DF/PF, inv/ever Lt ankle rockerboard x2' Seated heel raise with 8# resting on left knee 2x10 Blue tband ankle inversion, eversion, PF 2x10 each TUG, 5X STS, , FOTO 4" step up fwd and lateral x10 each, Lt LE Lt SLS with single rail clock taps Rt LE 2 rounds Standing at the bottom of the steps: left foot on 2nd step rocking 10x Vasocompression moderate compression coldest setting 10 min  with elevation   PATIENT EDUCATION:  Education details: [email or web address] Person educated: Patient Education method: Programmer, multimedia, Verbal cues, and Handouts Education comprehension: verbalized understanding  HOME EXERCISE PROGRAM: Access Code: [email or web address] URL: [email or web address] Date: 02/05/2023 Prepared by: Raynelle Fanning  Exercises - Ankle Pumps in Elevation  - 2 x daily - 7 x weekly - 2 sets - 10 reps - Ankle Circles in Elevation  - 2 x daily - 7 x weekly - 2 sets - 10 reps - Supine Active Straight Leg Raise  - 2 x daily - 7 x weekly - 2 sets - 10 reps - Sit to Stand  - 2 x daily - 7 x weekly - 3 sets - 5 reps - Seated Long Arc Quad  - 2 x daily - 7 x weekly - 2 sets - 10 reps - Standing Hip Abduction with Counter Support  - 2 x daily - 7 x weekly - 2 sets - 10 reps - Standing Hip Extension with Counter Support  - 2 x daily - 7 x weekly - 2 sets - 10 reps -  Seated Ankle Eversion with Resistance  - 1 x daily - 3-4 x weekly - 1 sets - 10 reps - Seated Ankle Inversion with Resistance  - 1 x daily - 3-4 x weekly - 1 sets - 10 reps - Heel Raises with Counter Support  - 1 x daily - 3-4 x weekly - 1 sets - 10 reps - Seated Piriformis Stretch with  Trunk Bend  - 2 x daily - 7 x weekly - 1 sets - 3 reps - 30-60 sec  hold  ASSESSMENT:  CLINICAL IMPRESSION: Ariana Jarvis is limited somewhat with gait due to hip pain. She felt good relief post gait with seated fig 4 stretch. She was able to ascend 4 inch curb today, but is limited with descending curb secondary to ROM deficits and pain. Plan to DN calf next visit to help with ankle flexibility as well as try trampoline activities. Recert needs to be sent 10/31.     OBJECTIVE IMPAIRMENTS: Abnormal gait, decreased activity tolerance, decreased balance, decreased coordination, decreased mobility, difficulty walking, decreased ROM, decreased strength, hypomobility, increased edema, impaired sensation, and pain.   ACTIVITY LIMITATIONS: standing, squatting, sleeping, stairs, transfers, and locomotion level  PARTICIPATION LIMITATIONS: cleaning, laundry, shopping, and community activity  PERSONAL FACTORS: Time since onset of injury/illness/exacerbation and 1 comorbidity: 3 surgeries to Lt ankle with complications following ORIF  are also affecting patient's functional outcome.   REHAB POTENTIAL: Excellent  CLINICAL DECISION MAKING: Stable/uncomplicated  EVALUATION COMPLEXITY: Low   GOALS: Goals reviewed with patient? Yes  SHORT TERM GOALS: Target date: 01/15/23 Pt will be ind with initial HEP Baseline: Goal status: met 10/2  2.  Pt will be compliant with edema management using elevation above heart and ice at least 2x/day. Baseline:  Goal status: IN PROGRESS   3.  Pt will improve Lt ankle strength to at least 4/5 Baseline:  Goal status: PARTIALLY MET 10/22  4.  Pt will improve TUG test to 11 sec Baseline:  Goal status: MET 10/7  5.  Pt will be able to perform covering 350' with sneaker and lace up brace Baseline:  Goal status: MET 10/7    LONG TERM GOALS: Target date: 02/12/23  Pt will be ind with advanced HEP Baseline:  Goal status: ONGOING  2.  Pt will achieve at  least 4+/5 strength throughout Lt LE for gait, transfers, curbs and functional task performance. Baseline:  Goal status: ONGOING -   3.  Improve FOTO score to at least 57% to demo improved functional performance. Baseline: 52%, 63% Goal status: MET 10/7  4.  Pt will be able to participate in covering 750' in sneaker with lace up brace for improved community outings. Baseline Goal status: PARTIALLY MET 10/22- 5 min 10 sec walked 827 ft  5.  Pt will be able to sleep through night with no or min pain interruption from Lt ankle. Baseline:  Goal status: ONGOING 10/22  6.  Pt will report improved Lt ankle pain to no greater than 6/10 with daily tasks. Baseline:  Goal status: MET 10/7   PLAN:  PT FREQUENCY: 2x/week  PT DURATION: 8 weeks  PLANNED INTERVENTIONS: Therapeutic exercises, Therapeutic activity, Neuromuscular re-education, Balance training, Gait training, Patient/Family education, Self Care, Stair training, DME instructions, Aquatic Therapy, Dry Needling, Cryotherapy, Taping, Vasopneumatic device, Ionotophoresis 4mg /ml Dexamethasone, and Manual therapy  PLAN FOR NEXT SESSION:  Send recert for 8 more visits (4 weeks), DN to calf, try trampoline, MD note done10/22 - send recert by 10/31), modify  and progress HEP as needed,  Lt ankle strength  Solon Palm, PT  02/05/23 4:37 PM  [facility] [place]  [phone]

## 2023-02-05 ENCOUNTER — Encounter: Payer: Self-pay | Admitting: Physical Therapy

## 2023-02-05 ENCOUNTER — Ambulatory Visit: Payer: Medicaid Other | Admitting: Physical Therapy

## 2023-02-05 DIAGNOSIS — M6281 Muscle weakness (generalized): Secondary | ICD-10-CM

## 2023-02-05 DIAGNOSIS — M25572 Pain in left ankle and joints of left foot: Secondary | ICD-10-CM

## 2023-02-05 DIAGNOSIS — R2689 Other abnormalities of gait and mobility: Secondary | ICD-10-CM

## 2023-02-10 ENCOUNTER — Encounter: Payer: Medicaid Other | Admitting: Physical Therapy

## 2023-02-11 ENCOUNTER — Ambulatory Visit: Payer: Medicaid Other | Admitting: Physical Therapy

## 2023-02-11 DIAGNOSIS — M25572 Pain in left ankle and joints of left foot: Secondary | ICD-10-CM

## 2023-02-11 DIAGNOSIS — R2689 Other abnormalities of gait and mobility: Secondary | ICD-10-CM

## 2023-02-11 DIAGNOSIS — M6281 Muscle weakness (generalized): Secondary | ICD-10-CM

## 2023-02-11 NOTE — Patient Instructions (Signed)

## 2023-02-11 NOTE — Therapy (Signed)
OUTPATIENT PHYSICAL THERAPY LOWER EXTREMITY TREATMENT/RECERTIFCATION   Patient Name: Ariana Jarvis MRN: [id number] DOB:1960-10-03, 62 y.o., female Today's Date: 02/11/2023  END OF SESSION:  PT End of Session - 02/11/23 1019     Visit Number 12    Number of Visits 13    Date for PT Re-Evaluation 04/15/23    Authorization Type [employer] - 4 visits approved through 1/3    Authorization Time Period 11/5 -1/3    Authorization - Visit Number 11    Authorization - Number of Visits 12    PT Start Time 1016    PT Stop Time 1100    PT Time Calculation (min) 44 min    Activity Tolerance Patient limited by pain;Patient tolerated treatment well                      Past Medical History:  Diagnosis Date   Anxiety    Arthritis    back   Back pain    Chronic back pain    Depression    Gastric ulcer    GERD (gastroesophageal reflux disease)    HLD (hyperlipidemia)    Hypertension    Past Surgical History:  Procedure Laterality Date   ANKLE ARTHROSCOPY WITH ARTHRODESIS Left 09/09/2022   Procedure: LEFT TIBIOTALAR JOINT ARTHRODESIS, DISTAL TIBIOFIBULAR JOINT ARTHRODESIS;  Surgeon: Terance Hart, MD;  Location: [facility] OR;  Service: Orthopedics;  Laterality: Left;  LENGTH OF SURGERY: 150 MINUTES   CESAREAN SECTION     x2   CHOLECYSTECTOMY     COLONOSCOPY     HARDWARE REMOVAL Left 05/16/2022   Procedure: HARDWARE REMOVAL LEFT ANKLE;  Surgeon: Joen Laura, MD;  Location: [facility];  Service: Orthopedics;  Laterality: Left;   ORIF ANKLE FRACTURE Left 03/14/2022   Procedure: OPEN REDUCTION INTERNAL FIXATION (ORIF) ANKLE FRACTURE;  Surgeon: Joen Laura, MD;  Location: [facility];  Service: Orthopedics;  Laterality: Left;   ORIF RADIAL FRACTURE Left 03/14/2022   Procedure: RADIAL HEAD ARTHROPLASTY;  Surgeon: Joen Laura, MD;  Location: [facility];  Service: Orthopedics;  Laterality: Left;    SYNDESMOSIS REPAIR Left 09/09/2022   Procedure: OPEN REDUCTION OF SYNDESMOSIS;  Surgeon: Terance Hart, MD;  Location: [facility] OR;  Service: Orthopedics;  Laterality: Left;   Patient Active Problem List   Diagnosis Date Noted   Arthritis of left ankle 09/09/2022    PCP: Quitman Livings, MD  REFERRING PROVIDER: Nicki Guadalajara, MD  REFERRING DIAG: s/p Lt ankle arthrodesis of tibiotalar and tibiofibular joints  THERAPY DIAG:  Pain in left ankle and joints of left foot  Muscle weakness (generalized)  Other abnormalities of gait and mobility  Rationale for Evaluation and Treatment: Rehabilitation  ONSET DATE: 09/09/22 surgery date, had traumatic fracture approx 3 mos before surgery  SUBJECTIVE:   SUBJECTIVE STATEMENT: It hurts on the top of the foot but it's getting better.    Goes by Ariana Jarvis   Pt is just over 3 mos s/p Lt tibiotalar and tibiofibular joint arthrodesis performed by Nicki Guadalajara, MD on 09/09/22.  She suffered a Lt ankle fracture with ORIF 03/14/22 and hardware removal from this in Feb 2024.  However, she had a post-traumatic collapse and avascular necrosis, necessitating subsequent surgery. Pt arrives wearing a walking boot on Lt foot but has been cleared to WB through Lt LE as tolerated.  She has a lace up/velcro brace to wear as needed.   At home I walk  on it without the brace.  I wear slippers a lot at home b/c my foot and ankle are too big to put my foot in.  My foot and ankle can swell a lot.  I have numbness along the sides.  I will elevated it throughout the day after walking a lot to help the swelling.  PERTINENT HISTORY: HTN, HLD, back arthritis PAIN:  PAIN:  Are you having pain? Yes NPRS scale: 3/10 Pain location: Lt top of the foot where it bends Pain orientation: Left  PAIN TYPE: aching, dull, sharp, throbbing, and tight Pain description: constant  Aggravating factors: standing and walking Relieving factors: pain meds, rest/elevation, using walking  boot   PRECAUTIONS: Fall  RED FLAGS: None   WEIGHT BEARING RESTRICTIONS:  cleared to WB through Lt LE as tolerated  FALLS:  Has patient fallen in last 6 months? Yes. Number of falls knee scooter tipped 1x  LIVING ENVIRONMENT: Lives with: lives with their family and lives alone Lives in: House/apartment Stairs: No Has following equipment at home:  walking boot for Lt foot as needed, has a wheelchair if needs to walk very far  OCCUPATION: unemployed  PLOF: Independent  PATIENT GOALS: get stronger, feel more confident and less pain walking with shoe on vs boot  NEXT MD VISIT: wanted to see her in 6 weeks  OBJECTIVE:   DIAGNOSTIC FINDINGS:   PATIENT SURVEYS:  FOTO: 63% MET GOAL FOTO 52% goal 57% 10/30 51%  COGNITION: Overall cognitive status: Within functional limits for tasks assessed     SENSATION: Numbness along sides of Lt ankle since surgery medial> lateral  EDEMA:  10/17: Figure 8: Rt 54.5cm, Lt 58.5cm  Figure 8: Lt 55cm, Rt 51cm  MUSCLE LENGTH: Lt Gastroc limited 50% WNL hips and knees bil  POSTURE: No Significant postural limitations  PALPATION: Both tender and numb along medial and lateral aspects of ankle and foot  LOWER EXTREMITY ROM: 10/30:  Left ankle  active ROM: PF 50 DF 2 active, 6 passive Inv 22 Ever 30   02/03/23 A/P ROM L ankle   PF 53/63 DF -6/0 Inv 25/34  pain Ever 30/24  01/27/23: Lt 10 DF, 12 PF  Eval: Lt ankle 0 deg DF, 41 deg PF Rt ankle 12 deg DF, 55 deg PF   LOWER EXTREMITY MMT: 10/22  DF 5/5, Ever and Inv 4+/5, PF 4-/5 (tested in sitting) painful  10/17: Lt knee 5/5, Lt ankle 4/5   Bil hips 5/5  Rt knee 5/5 Lt knee 4/5  Rt ankle 5/5  Lt ankle 3+/5 all cardinal planes of motion    FUNCTIONAL TESTS:  10/22: (stopped at 5 min and 10 sec due to pain) 827 ft.  10/17: 5x STS: 9.96 TUG: 9.62 3 MWT: 599'  10/7:  5x STS: 10.41 TUG: 9.92 3 min walk test: 563'  Eval: 5 times sit to stand: 15.13  hands on thighs Timed up and go (TUG): 12.95 wearing walking boot 2 minute walk test: 31' with Lt walking boot  GAIT: Distance walked: 312' Assistive device utilized:  wearing Lt walking boot Level of assistance: Modified independence Comments: some lateral ankle weakness within boot  10/22: decreased stance time left due to decreased DF, increased hip ER    TODAY'S TREATMENT:  DATE:  02/11/23 Nu-Step L3 15 min while discussing status and DN option Seated heel raise 10# 25x FOTO Ankle ROM 2nd step rocking for ankle DF (added red band at talocrual joint for mobilization-no change in pain) Seated ankle DF with red band WB on left with tap steps no UE support needed 6 inch FW step ups 10x Up and over 4 inch step (very challenging to descend)  Trigger Point Dry-Needling  Treatment instructions: Expect mild to moderate muscle soreness. S/S of pneumothorax if dry needled over a lung field, and to seek immediate medical attention should they occur. Patient verbalized understanding of these instructions and education.  Patient Consent Given: Yes Education handout provided: Yes Muscles treated: left gastroc and soleus Electrical stimulation performed: No Parameters: N/A Treatment response/outcome: improved soft tissue mobility   Heat 2 min following 02/05/23 Walking x60min - stop due to hip pain and some ankle pain  Seated fig 4 2x30 sec Curb negotiation outside - up with left down with R 4 inch step ups fwd x 10, lateral x 10; 6 inch lateral x 10 Seated Lt heel raise, 10# on thigh x20 Seated DF x20 Seated DF and eversion with band yellow x 20 Seated PF Red Tband x 20 Lt ankle slides into PF/DF 2x10 Step downs 2 inch x 10 Manual: manual resorption for edema, PROM all planes Ice x 10 min to L ankle at end of session  02/03/23 Walking x 5 min 10 Sec 827  ft: PT cues to focus on proper push off and decrease foot eversion  and hip ER - pain increased to 6/10 Goals assessed, MMT, ROM for MD note Manual: STM to Lt gastoc/soleus, ankle tendons anterior and lateral to reduce soreness in addition to scar massage; retrograde massage to posterior/lateral ankle for edema reduction Cuboid mobs, mid and forefoot mobs, talocrural and calcaneal mobs and PROM of left ankle in all directions  01/29/23: MMT, 5X STS, TUG, (see measurements above) Seated on elevated mat table with feet dangling: gentle mob with movement Lt ankle DF while swinging knee into flexion Supine in SAQ positioning over bolster: Lt ankle A/ROM 2x10 PF/DF, Inv/Ever STM to Lt plantar fascia, ankle tendons anterior and lateral to reduce post-exercise soreness and retrograde massage to posterior/lateral ankle for edema reduction Ice pack Lt ankle x8'   01/27/23 Walking x38min: PT cues to focus on proper push off and decrease foot eversion Seated Lt heel raise, 8# on thigh x20 Seated DF x20 Lt ankle slides into PF/DF 2x10 Vaso end of session x10 min, medium compression, coldest temp Manual:  AP talocrural mobs grade III-IV Mid foot stretches and mobilization grade IV Proximal fibular mobs AP grade IV   PATIENT EDUCATION:  Education details: [email or web address] Person educated: Patient Education method: Programmer, multimedia, Verbal cues, and Handouts Education comprehension: verbalized understanding  HOME EXERCISE PROGRAM: Access Code: [email or web address] URL: [email or web address] Date: 02/05/2023 Prepared by: Raynelle Fanning  Exercises - Ankle Pumps in Elevation  - 2 x daily - 7 x weekly - 2 sets - 10 reps - Ankle Circles in Elevation  - 2 x daily - 7 x weekly - 2 sets - 10 reps - Supine Active Straight Leg Raise  - 2 x daily - 7 x weekly - 2 sets - 10 reps - Sit to Stand  - 2 x daily - 7 x weekly - 3 sets - 5 reps - Seated Long Arc Quad  - 2 x daily - 7 x weekly - 2 sets - 10 reps -  Standing Hip  Abduction with Counter Support  - 2 x daily - 7 x weekly - 2 sets - 10 reps - Standing Hip Extension with Counter Support  - 2 x daily - 7 x weekly - 2 sets - 10 reps - Seated Ankle Eversion with Resistance  - 1 x daily - 3-4 x weekly - 1 sets - 10 reps - Seated Ankle Inversion with Resistance  - 1 x daily - 3-4 x weekly - 1 sets - 10 reps - Heel Raises with Counter Support  - 1 x daily - 3-4 x weekly - 1 sets - 10 reps - Seated Piriformis Stretch with Trunk Bend  - 2 x daily - 7 x weekly - 1 sets - 3 reps - 30-60 sec  hold  ASSESSMENT:  CLINICAL IMPRESSION: The patient is improving with overall pain reduction but still painful on the dorsal aspect of ankle.  Improvements with ankle ROM however dorsiflexion limitation affecting ability to descend steps reciprocally.  The patient had a positive initial response to DN with much improved soft tissue mobility noted.  Therapist monitoring response and educating patient on what to expect following DN and how to optimize benefit with specific exercise.  Anticipate pain intensity and mobility will continue to improve over the next few days.  The patient would benefit from a continuation of skilled PT for a further progression of strengthening and functional mobility.  Will continue to update and promote independence in a HEP needed for a return to the highest functional level possible with ADLs.       OBJECTIVE IMPAIRMENTS: Abnormal gait, decreased activity tolerance, decreased balance, decreased coordination, decreased mobility, difficulty walking, decreased ROM, decreased strength, hypomobility, increased edema, impaired sensation, and pain.   ACTIVITY LIMITATIONS: standing, squatting, sleeping, stairs, transfers, and locomotion level  PARTICIPATION LIMITATIONS: cleaning, laundry, shopping, and community activity  PERSONAL FACTORS: Time since onset of injury/illness/exacerbation and 1 comorbidity: 3 surgeries to Lt ankle with complications following  ORIF  are also affecting patient's functional outcome.   REHAB POTENTIAL: Excellent  CLINICAL DECISION MAKING: Stable/uncomplicated  EVALUATION COMPLEXITY: Low   GOALS: Goals reviewed with patient? Yes  SHORT TERM GOALS: Target date: 01/15/23 Pt will be ind with initial HEP Baseline: Goal status: met 10/2  2.  Pt will be compliant with edema management using elevation above heart and ice at least 2x/day. Baseline:  Goal status: IN PROGRESS   3.  Pt will improve Lt ankle strength to at least 4/5 Baseline:  Goal status: PARTIALLY MET 10/22  4.  Pt will improve TUG test to 11 sec Baseline:  Goal status: MET 10/7  5.  Pt will be able to perform covering 350' with sneaker and lace up brace Baseline:  Goal status: MET 10/7    LONG TERM GOALS: Target date: 04/15/23  Pt will be ind with advanced HEP Baseline:  Goal status: ONGOING  2.  Pt will achieve at least 4+/5 strength throughout Lt LE for gait, transfers, curbs and functional task performance. Baseline:  Goal status: ONGOING -   3.  Improve FOTO score to at least 57% to demo improved functional performance. Baseline: 52%, 63% Goal status: MET 10/7  4.  Pt will be able to participate in covering 750' in sneaker with lace up brace for improved community outings. Baseline Goal status: PARTIALLY MET 10/22- 5 min 10 sec walked 827 ft  5.  Pt will be able to sleep through night with no or min pain interruption  from Lt ankle. Baseline:  Goal status: ONGOING 10/22  6.  Pt will report improved Lt ankle pain to no greater than 6/10 with daily tasks. Baseline:  Goal status: MET 10/7   PLAN:  PT FREQUENCY: 1x/week  PT DURATION: 9 weeks  PLANNED INTERVENTIONS: Therapeutic exercises, Therapeutic activity, Neuromuscular re-education, Balance training, Gait training, Patient/Family education, Self Care, Stair training, DME instructions, Aquatic Therapy, Dry Needling, Cryotherapy, Taping, Vasopneumatic device,  Ionotophoresis 4mg /ml Dexamethasone, and Manual therapy  PLAN FOR NEXT SESSION: recert completed today, next visit submit request for additional visits to insurance;  assess response to DN #1 to calf, try trampoline,  modify and progress HEP as needed,  Lt ankle strength  Lavinia Sharps, PT 02/11/23 4:57 PM Phone: [phone] Fax: [phone]  [facility] [place]  [phone]

## 2023-02-12 ENCOUNTER — Ambulatory Visit: Payer: Medicaid Other | Admitting: Physical Therapy

## 2023-02-19 ENCOUNTER — Telehealth: Payer: Self-pay | Admitting: Physical Therapy

## 2023-02-19 ENCOUNTER — Ambulatory Visit: Payer: Medicaid Other | Attending: Orthopaedic Surgery | Admitting: Physical Therapy

## 2023-02-19 DIAGNOSIS — R2689 Other abnormalities of gait and mobility: Secondary | ICD-10-CM | POA: Insufficient documentation

## 2023-02-19 DIAGNOSIS — M25572 Pain in left ankle and joints of left foot: Secondary | ICD-10-CM | POA: Insufficient documentation

## 2023-02-19 DIAGNOSIS — M6281 Muscle weakness (generalized): Secondary | ICD-10-CM | POA: Insufficient documentation

## 2023-02-19 NOTE — Telephone Encounter (Signed)
Phoned patient due to missed appt. Left message with next appt time.

## 2023-02-26 NOTE — Therapy (Signed)
OUTPATIENT PHYSICAL THERAPY LOWER EXTREMITY TREATMENT   Patient Name: Ariana Jarvis MRN: [id number] DOB:1960/12/24, 62 y.o., female Today's Date: 02/27/2023  END OF SESSION:  PT End of Session - 02/27/23 1021     Visit Number 13    Number of Visits 13    Date for PT Re-Evaluation 04/15/23    Authorization Type [employer] - 4 visits approved through 1/3    Authorization Time Period 11/5 -1/3    Authorization - Visit Number 12    Authorization - Number of Visits 12    PT Start Time 1019    PT Stop Time 1059    PT Time Calculation (min) 40 min    Activity Tolerance Patient tolerated treatment well    Behavior During Therapy WFL for tasks assessed/performed               Past Medical History:  Diagnosis Date   Anxiety    Arthritis    back   Back pain    Chronic back pain    Depression    Gastric ulcer    GERD (gastroesophageal reflux disease)    HLD (hyperlipidemia)    Hypertension    Past Surgical History:  Procedure Laterality Date   ANKLE ARTHROSCOPY WITH ARTHRODESIS Left 09/09/2022   Procedure: LEFT TIBIOTALAR JOINT ARTHRODESIS, DISTAL TIBIOFIBULAR JOINT ARTHRODESIS;  Surgeon: Terance Hart, MD;  Location: [facility] OR;  Service: Orthopedics;  Laterality: Left;  LENGTH OF SURGERY: 150 MINUTES   CESAREAN SECTION     x2   CHOLECYSTECTOMY     COLONOSCOPY     HARDWARE REMOVAL Left 05/16/2022   Procedure: HARDWARE REMOVAL LEFT ANKLE;  Surgeon: Joen Laura, MD;  Location: [facility];  Service: Orthopedics;  Laterality: Left;   ORIF ANKLE FRACTURE Left 03/14/2022   Procedure: OPEN REDUCTION INTERNAL FIXATION (ORIF) ANKLE FRACTURE;  Surgeon: Joen Laura, MD;  Location: [facility];  Service: Orthopedics;  Laterality: Left;   ORIF RADIAL FRACTURE Left 03/14/2022   Procedure: RADIAL HEAD ARTHROPLASTY;  Surgeon: Joen Laura, MD;  Location: [facility];  Service: Orthopedics;  Laterality: Left;    SYNDESMOSIS REPAIR Left 09/09/2022   Procedure: OPEN REDUCTION OF SYNDESMOSIS;  Surgeon: Terance Hart, MD;  Location: [facility];  Service: Orthopedics;  Laterality: Left;   Patient Active Problem List   Diagnosis Date Noted   Arthritis of left ankle 09/09/2022    PCP: Quitman Livings, MD  REFERRING PROVIDER: Nicki Guadalajara, MD  REFERRING DIAG: s/p Lt ankle arthrodesis of tibiotalar and tibiofibular joints  THERAPY DIAG:  Pain in left ankle and joints of left foot  Muscle weakness (generalized)  Other abnormalities of gait and mobility  Rationale for Evaluation and Treatment: Rehabilitation  ONSET DATE: 09/09/22 surgery date, had traumatic fracture approx 3 mos before surgery  SUBJECTIVE:   SUBJECTIVE STATEMENT: The ankle is feeling pretty good. I had shots B SIJ two days ago. It's really sore.  Ankle still wakes her up at times with  a sharp pain.  Goes by Ariana Jarvis   Pt is just over 3 mos s/p Lt tibiotalar and tibiofibular joint arthrodesis performed by Nicki Guadalajara, MD on 09/09/22.  She suffered a Lt ankle fracture with ORIF 03/14/22 and hardware removal from this in Feb 2024.  However, she had a post-traumatic collapse and avascular necrosis, necessitating subsequent surgery. Pt arrives wearing a walking boot on Lt foot but has been cleared to WB through Lt LE as tolerated.  She has a lace up/velcro brace to wear as needed.   At home I walk on it without the brace.  I wear slippers a lot at home b/c my foot and ankle are too big to put my foot in.  My foot and ankle can swell a lot.  I have numbness along the sides.  I will elevated it throughout the day after walking a lot to help the swelling.  PERTINENT HISTORY: HTN, HLD, back arthritis PAIN:  PAIN:  Are you having pain? Yes NPRS scale: 3/10 Pain location: lateral side  Pain orientation: Left  PAIN TYPE: aching, dull, sharp, throbbing, and tight Pain description: constant  Aggravating factors: standing and  walking Relieving factors: pain meds, rest/elevation, using walking boot   PRECAUTIONS: Fall  RED FLAGS: None   WEIGHT BEARING RESTRICTIONS:  cleared to WB through Lt LE as tolerated  FALLS:  Has patient fallen in last 6 months? Yes. Number of falls knee scooter tipped 1x  LIVING ENVIRONMENT: Lives with: lives with their family and lives alone Lives in: House/apartment Stairs: No Has following equipment at home:  walking boot for Lt foot as needed, has a wheelchair if needs to walk very far  OCCUPATION: unemployed  PLOF: Independent  PATIENT GOALS: get stronger, feel more confident and less pain walking with shoe on vs boot  NEXT MD VISIT: wanted to see her in 6 weeks  OBJECTIVE:   DIAGNOSTIC FINDINGS:   PATIENT SURVEYS:  FOTO: 63% MET GOAL FOTO 52% goal 57% 10/30 51%  COGNITION: Overall cognitive status: Within functional limits for tasks assessed     SENSATION: Numbness along sides of Lt ankle since surgery medial> lateral  EDEMA:  10/17: Figure 8: Rt 54.5cm, Lt 58.5cm  Figure 8: Lt 55cm, Rt 51cm  MUSCLE LENGTH: Lt Gastroc limited 50% WNL hips and knees bil  POSTURE: No Significant postural limitations  PALPATION: Both tender and numb along medial and lateral aspects of ankle and foot  LOWER EXTREMITY ROM: 10/30:  Left ankle  active ROM: PF 50 DF 2 active, 6 passive Inv 22 Ever 30   02/03/23 A/P ROM L ankle   PF 53/63 DF -6/0 Inv 25/34  pain Ever 30/24  01/27/23: Lt 10 DF, 12 PF  Eval: Lt ankle 0 deg DF, 41 deg PF Rt ankle 12 deg DF, 55 deg PF   LOWER EXTREMITY MMT: 10/22  DF 5/5, Ever and Inv 4+/5, PF 4-/5 (tested in sitting) painful  10/17: Lt knee 5/5, Lt ankle 4/5   Bil hips 5/5  Rt knee 5/5 Lt knee 4/5  Rt ankle 5/5  Lt ankle 3+/5 all cardinal planes of motion    FUNCTIONAL TESTS:  10/22: (stopped at 5 min and 10 sec due to pain) 827 ft.  10/17: 5x STS: 9.96 TUG: 9.62 3 MWT: 599'  10/7:  5x STS:  10.41 TUG: 9.92 3 min walk test: 563'  Eval: 5 times sit to stand: 15.13 hands on thighs Timed up and go (TUG): 12.95 wearing walking boot 2 minute walk test: 69' with Lt walking boot  GAIT: Distance walked: 312' Assistive device utilized:  wearing Lt walking boot Level of assistance: Modified independence Comments: some lateral ankle weakness within boot  10/22: decreased stance time left due to decreased DF, increased hip ER    TODAY'S TREATMENT:  DATE:   02/27/23 Bike x 5  while discussing status and DN option Seated heel raise 10# 25x Ankle ROM Seated ankle DF with red band 40 reps Seated ankle Inv red x 20 reps SLS on left with cone taps intermittent UE support 6 inch FW step ups 20x Up and over 4 inch step x 10 worked on controlling descent Tandem walking at [employer] x 8 laps  Foam: standing normal stance, narrow BOS EO/EC, step ups x 10 with L Tandem stance in corner B for HEP  02/11/23 Nu-Step L3 15 min while discussing status and DN option Seated heel raise 10# 25x Seated ankle DF with red band FOTO Ankle ROM 2nd step rocking for ankle DF (added red band at talocrual joint for mobilization-no change in pain) WB on left with tap steps no UE support needed 6 inch FW step ups 10x Up and over 4 inch step (very challenging to descend)  Trigger Point Dry-Needling  Treatment instructions: Expect mild to moderate muscle soreness. S/S of pneumothorax if dry needled over a lung field, and to seek immediate medical attention should they occur. Patient verbalized understanding of these instructions and education.  Patient Consent Given: Yes Education handout provided: Yes Muscles treated: left gastroc and soleus Electrical stimulation performed: No Parameters: N/A Treatment response/outcome: improved soft tissue mobility   Heat 2 min  following  02/05/23 Walking x46min - stop due to hip pain and some ankle pain  Seated fig 4 2x30 sec Curb negotiation outside - up with left down with R 4 inch step ups fwd x 10, lateral x 10; 6 inch lateral x 10 Seated Lt heel raise, 10# on thigh x20 Seated DF x20 Seated DF and eversion with band yellow x 20 Seated PF Red Tband x 20 Lt ankle slides into PF/DF 2x10 Step downs 2 inch x 10 Manual: manual resorption for edema, PROM all planes Ice x 10 min to L ankle at end of session  02/03/23 Walking x 5 min 10 Sec 827 ft: PT cues to focus on proper push off and decrease foot eversion  and hip ER - pain increased to 6/10 Goals assessed, MMT, ROM for MD note Manual: STM to Lt gastoc/soleus, ankle tendons anterior and lateral to reduce soreness in addition to scar massage; retrograde massage to posterior/lateral ankle for edema reduction Cuboid mobs, mid and forefoot mobs, talocrural and calcaneal mobs and PROM of left ankle in all directions   PATIENT EDUCATION:  Education details: [email or web address] Person educated: Patient Education method: Explanation, Verbal cues, and Handouts Education comprehension: verbalized understanding  HOME EXERCISE PROGRAM: Access Code: [email or web address] URL: [email or web address] Date: 02/27/2023 Prepared by: Raynelle Fanning  Exercises - Ankle Pumps in Elevation  - 2 x daily - 7 x weekly - 2 sets - 10 reps - Ankle Circles in Elevation  - 2 x daily - 7 x weekly - 2 sets - 10 reps - Supine Active Straight Leg Raise  - 2 x daily - 7 x weekly - 2 sets - 10 reps - Sit to Stand  - 2 x daily - 7 x weekly - 3 sets - 5 reps - Seated Long Arc Quad  - 2 x daily - 7 x weekly - 2 sets - 10 reps - Standing Hip Abduction with Counter Support  - 2 x daily - 7 x weekly - 2 sets - 10 reps - Standing Hip Extension with Counter Support  - 2 x daily - 7 x weekly - 2 sets - 10 reps -  Seated Ankle Eversion with Resistance  - 1 x daily - 3-4 x weekly - 1 sets - 10 reps - Seated Ankle  Inversion with Resistance  - 1 x daily - 3-4 x weekly - 1 sets - 10 reps - Heel Raises with Counter Support  - 1 x daily - 3-4 x weekly - 1 sets - 10 reps - Seated Piriformis Stretch with Trunk Bend  - 2 x daily - 7 x weekly - 1 sets - 3 reps - 30-60 sec  hold - Tandem Stance in Corner  - 1 x daily - 3-4 x weekly - 1 sets - 10 reps  ASSESSMENT:  CLINICAL IMPRESSION: Ariana Jarvis presents today with complaints of soreness in her low back and hips after having injections two days ago. Her ankle pain remains about the same, but overall she is doing better. She was challenged with balance activities today both SLS and tandem stance and would benefit from more of the same. HEP was progressed for tandem stance. She did better today with stepping up and over on the 4 inch step with cueing to bend her knee. She reports that she uses lateral step ups/downs in the community because it is easier. Reauthorization was submitted for 8 more visits.       OBJECTIVE IMPAIRMENTS: Abnormal gait, decreased activity tolerance, decreased balance, decreased coordination, decreased mobility, difficulty walking, decreased ROM, decreased strength, hypomobility, increased edema, impaired sensation, and pain.   ACTIVITY LIMITATIONS: standing, squatting, sleeping, stairs, transfers, and locomotion level  PARTICIPATION LIMITATIONS: cleaning, laundry, shopping, and community activity  PERSONAL FACTORS: Time since onset of injury/illness/exacerbation and 1 comorbidity: 3 surgeries to Lt ankle with complications following ORIF  are also affecting patient's functional outcome.   REHAB POTENTIAL: Excellent  CLINICAL DECISION MAKING: Stable/uncomplicated  EVALUATION COMPLEXITY: Low   GOALS: Goals reviewed with patient? Yes  SHORT TERM GOALS: Target date: 01/15/23 Pt will be ind with initial HEP Baseline: Goal status: met 10/2  2.  Pt will be compliant with edema management using elevation above heart and ice at least  2x/day. Baseline:  Goal status: IN PROGRESS   3.  Pt will improve Lt ankle strength to at least 4/5 Baseline:  Goal status: PARTIALLY MET 10/22  4.  Pt will improve TUG test to 11 sec Baseline:  Goal status: MET 10/7  5.  Pt will be able to perform covering 350' with sneaker and lace up brace Baseline:  Goal status: MET 10/7    LONG TERM GOALS: Target date: 04/15/23  Pt will be ind with advanced HEP Baseline:  Goal status: ONGOING  2.  Pt will achieve at least 4+/5 strength throughout Lt LE for gait, transfers, curbs and functional task performance. Baseline:  Goal status: ONGOING -   3.  Improve FOTO score to at least 57% to demo improved functional performance. Baseline: 52%, 63% Goal status: MET 10/7  4.  Pt will be able to participate in covering 750' in sneaker with lace up brace for improved community outings. Baseline Goal status: PARTIALLY MET 10/22- 5 min 10 sec walked 827 ft  5.  Pt will be able to sleep through night with no or min pain interruption from Lt ankle. Baseline:  Goal status: ONGOING 10/22  6.  Pt will report improved Lt ankle pain to no greater than 6/10 with daily tasks. Baseline:  Goal status: MET 10/7   PLAN:  PT FREQUENCY: 1x/week  PT DURATION: 9 weeks  PLANNED INTERVENTIONS: Therapeutic exercises, Therapeutic  activity, Neuromuscular re-education, Balance training, Gait training, Patient/Family education, Self Care, Stair training, DME instructions, Aquatic Therapy, Dry Needling, Cryotherapy, Taping, Vasopneumatic device, Ionotophoresis 4mg /ml Dexamethasone, and Manual therapy  PLAN FOR NEXT SESSION: reauth request submitted for 8 visits, continue with balance,  try trampoline,  modify and progress HEP as needed,  Lt ankle strength  Solon Palm, PT  02/27/23 11:16 AM Phone: [phone] Fax: [phone]  [facility] [place]  [phone]

## 2023-02-27 ENCOUNTER — Encounter: Payer: Self-pay | Admitting: Physical Therapy

## 2023-02-27 ENCOUNTER — Ambulatory Visit: Payer: Medicaid Other | Admitting: Physical Therapy

## 2023-02-27 DIAGNOSIS — M25572 Pain in left ankle and joints of left foot: Secondary | ICD-10-CM

## 2023-02-27 DIAGNOSIS — M6281 Muscle weakness (generalized): Secondary | ICD-10-CM | POA: Diagnosis present

## 2023-02-27 DIAGNOSIS — R2689 Other abnormalities of gait and mobility: Secondary | ICD-10-CM | POA: Diagnosis present

## 2023-03-04 ENCOUNTER — Ambulatory Visit: Payer: Medicaid Other | Admitting: Physical Therapy

## 2023-03-04 DIAGNOSIS — M25572 Pain in left ankle and joints of left foot: Secondary | ICD-10-CM | POA: Diagnosis not present

## 2023-03-04 DIAGNOSIS — R2689 Other abnormalities of gait and mobility: Secondary | ICD-10-CM

## 2023-03-04 DIAGNOSIS — M6281 Muscle weakness (generalized): Secondary | ICD-10-CM

## 2023-03-04 NOTE — Therapy (Signed)
OUTPATIENT PHYSICAL THERAPY LOWER EXTREMITY TREATMENT   Patient Name: Ariana Jarvis MRN: [id number] DOB:1960/07/13, 62 y.o., female Today's Date: 03/04/2023  END OF SESSION:  PT End of Session - 03/04/23 1022     Visit Number 14    Number of Visits 20    Date for PT Re-Evaluation 04/15/23    Authorization Type [employer] -  8 visits+ 4 visits + 8 visits = 20 visits  approved through 1/1    Authorization Time Period 04/15/23    Authorization - Visit Number 14    Authorization - Number of Visits 20    PT Start Time 1017    PT Stop Time 1100    PT Time Calculation (min) 43 min    Activity Tolerance Patient tolerated treatment well               Past Medical History:  Diagnosis Date   Anxiety    Arthritis    back   Back pain    Chronic back pain    Depression    Gastric ulcer    GERD (gastroesophageal reflux disease)    HLD (hyperlipidemia)    Hypertension    Past Surgical History:  Procedure Laterality Date   ANKLE ARTHROSCOPY WITH ARTHRODESIS Left 09/09/2022   Procedure: LEFT TIBIOTALAR JOINT ARTHRODESIS, DISTAL TIBIOFIBULAR JOINT ARTHRODESIS;  Surgeon: Terance Hart, MD;  Location: [facility] OR;  Service: Orthopedics;  Laterality: Left;  LENGTH OF SURGERY: 150 MINUTES   CESAREAN SECTION     x2   CHOLECYSTECTOMY     COLONOSCOPY     HARDWARE REMOVAL Left 05/16/2022   Procedure: HARDWARE REMOVAL LEFT ANKLE;  Surgeon: Joen Laura, MD;  Location: [facility];  Service: Orthopedics;  Laterality: Left;   ORIF ANKLE FRACTURE Left 03/14/2022   Procedure: OPEN REDUCTION INTERNAL FIXATION (ORIF) ANKLE FRACTURE;  Surgeon: Joen Laura, MD;  Location: [facility];  Service: Orthopedics;  Laterality: Left;   ORIF RADIAL FRACTURE Left 03/14/2022   Procedure: RADIAL HEAD ARTHROPLASTY;  Surgeon: Joen Laura, MD;  Location:  SURGERY CENTER;  Service: Orthopedics;  Laterality: Left;   SYNDESMOSIS REPAIR Left  09/09/2022   Procedure: OPEN REDUCTION OF SYNDESMOSIS;  Surgeon: Terance Hart, MD;  Location: [facility] OR;  Service: Orthopedics;  Laterality: Left;   Patient Active Problem List   Diagnosis Date Noted   Arthritis of left ankle 09/09/2022    PCP: Ariana Livings, MD  REFERRING PROVIDER: Nicki Guadalajara, MD  REFERRING DIAG: s/p Lt ankle arthrodesis of tibiotalar and tibiofibular joints  THERAPY DIAG:  Pain in left ankle and joints of left foot  Muscle weakness (generalized)  Other abnormalities of gait and mobility  Rationale for Evaluation and Treatment: Rehabilitation  ONSET DATE: 09/09/22 surgery date, had traumatic fracture approx 3 mos before surgery  SUBJECTIVE:   SUBJECTIVE STATEMENT: I'm doing a lot better than when I first came here.  The SIJ joints helped some but it bothers me with walking.  I got my dog back and now I'm walking better on uneven ground.   The ankle is feeling pretty good. I had shots B SIJ two days ago. It's really sore.  Ankle still wakes her up at times with  a sharp pain.  Goes by Ariana Jarvis   Pt is just over 3 mos s/p Lt tibiotalar and tibiofibular joint arthrodesis performed by Ariana Guadalajara, MD on 09/09/22.  She suffered a Lt ankle fracture with ORIF 03/14/22 and hardware removal from  this in Feb 2024.  However, she had a post-traumatic collapse and avascular necrosis, necessitating subsequent surgery. Pt arrives wearing a walking boot on Lt foot but has been cleared to WB through Lt LE as tolerated.  She has a lace up/velcro brace to wear as needed.   At home I walk on it without the brace.  I wear slippers a lot at home b/c my foot and ankle are too big to put my foot in.  My foot and ankle can swell a lot.  I have numbness along the sides.  I will elevated it throughout the day after walking a lot to help the swelling.  PERTINENT HISTORY: HTN, HLD, back arthritis PAIN:  PAIN:  Are you having pain? Yes NPRS scale: 3/10 Pain location: lateral side  Pain  orientation: Left  PAIN TYPE: aching, dull, sharp, throbbing, and tight Pain description: constant  Aggravating factors: standing and walking Relieving factors: pain meds, rest/elevation, using walking boot   PRECAUTIONS: Fall  RED FLAGS: None   WEIGHT BEARING RESTRICTIONS:  cleared to WB through Lt LE as tolerated  FALLS:  Has patient fallen in last 6 months? Yes. Number of falls knee scooter tipped 1x  LIVING ENVIRONMENT: Lives with: lives with their family and lives alone Lives in: House/apartment Stairs: No Has following equipment at home:  walking boot for Lt foot as needed, has a wheelchair if needs to walk very far  OCCUPATION: unemployed  PLOF: Independent  PATIENT GOALS: get stronger, feel more confident and less pain walking with shoe on vs boot  NEXT MD VISIT: wanted to see her in 6 weeks  OBJECTIVE:   DIAGNOSTIC FINDINGS:   PATIENT SURVEYS:  FOTO: 63% MET GOAL FOTO 52% goal 57% 10/30 51%  COGNITION: Overall cognitive status: Within functional limits for tasks assessed     SENSATION: Numbness along sides of Lt ankle since surgery medial> lateral  EDEMA:  10/17: Figure 8: Rt 54.5cm, Lt 58.5cm  Figure 8: Lt 55cm, Rt 51cm  MUSCLE LENGTH: Lt Gastroc limited 50% WNL hips and knees bil  POSTURE: No Significant postural limitations  PALPATION: Both tender and numb along medial and lateral aspects of ankle and foot  LOWER EXTREMITY ROM: 10/30:  Left ankle  active ROM: PF 50 DF 2 active, 6 passive Inv 22 Ever 30   02/03/23 A/P ROM L ankle   PF 53/63 DF -6/0 Inv 25/34  pain Ever 30/24  01/27/23: Lt 10 DF, 12 PF  Eval: Lt ankle 0 deg DF, 41 deg PF Rt ankle 12 deg DF, 55 deg PF   LOWER EXTREMITY MMT: 10/22  DF 5/5, Ever and Inv 4+/5, PF 4-/5 (tested in sitting) painful  10/17: Lt knee 5/5, Lt ankle 4/5   Bil hips 5/5  Rt knee 5/5 Lt knee 4/5  Rt ankle 5/5  Lt ankle 3+/5 all cardinal planes of motion    FUNCTIONAL TESTS:   10/22: (stopped at 5 min and 10 sec due to pain) 827 ft.  10/17: 5x STS: 9.96 TUG: 9.62 3 MWT: 599'  10/7:  5x STS: 10.41 TUG: 9.92 3 min walk test: 563'  Eval: 5 times sit to stand: 15.13 hands on thighs Timed up and go (TUG): 12.95 wearing walking boot 2 minute walk test: 42' with Lt walking boot  GAIT: Distance walked: 312' Assistive device utilized:  wearing Lt walking boot Level of assistance: Modified independence Comments: some lateral ankle weakness within boot  10/22: decreased stance time left due to  decreased DF, increased hip ER    TODAY'S TREATMENT:                                                                                                                              DATE:  03/04/23 NuStep L5 8 min Seated heel raise 10# 25x Seated yellow loop ankle eversion 20x Heel raises with small ball squeeze between heels 10x Slant board calf stretch 20 sec holds 5x Lunge stretch in chair 10x 2 inch forward step ups, lateral step ups, step downs 5x each 2 rounds WB on left with circles taps with right (decreasing UE support but min assist at times to recover loss of balance) Balance on foam: with weight shifts, UE movements, head movements, narrow base of support, eyes closed Vasocompression left ankle coldest setting, moderate compression 10 min with elevation   02/27/23 Bike x 5  while discussing status and DN option Seated heel raise 10# 25x Ankle ROM Seated ankle DF with red band 40 reps Seated ankle Inv red x 20 reps SLS on left with cone taps intermittent UE support 6 inch FW step ups 20x Up and over 4 inch step x 10 worked on controlling descent Tandem walking at [employer] x 8 laps  Foam: standing normal stance, narrow BOS EO/EC, step ups x 10 with L Tandem stance in corner B for HEP  02/11/23 Nu-Step L3 15 min while discussing status and DN option Seated heel raise 10# 25x Seated ankle DF with red band FOTO Ankle ROM 2nd step rocking for  ankle DF (added red band at talocrual joint for mobilization-no change in pain) WB on left with tap steps no UE support needed 6 inch FW step ups 10x Up and over 4 inch step (very challenging to descend)  Trigger Point Dry-Needling  Treatment instructions: Expect mild to moderate muscle soreness. S/S of pneumothorax if dry needled over a lung field, and to seek immediate medical attention should they occur. Patient verbalized understanding of these instructions and education.  Patient Consent Given: Yes Education handout provided: Yes Muscles treated: left gastroc and soleus Electrical stimulation performed: No Parameters: N/A Treatment response/outcome: improved soft tissue mobility   Heat 2 min following  02/05/23 Walking x8min - stop due to hip pain and some ankle pain  Seated fig 4 2x30 sec Curb negotiation outside - up with left down with R 4 inch step ups fwd x 10, lateral x 10; 6 inch lateral x 10 Seated Lt heel raise, 10# on thigh x20 Seated DF x20 Seated DF and eversion with band yellow x 20 Seated PF Red Tband x 20 Lt ankle slides into PF/DF 2x10 Step downs 2 inch x 10 Manual: manual resorption for edema, PROM all planes Ice x 10 min to L ankle at end of session  02/03/23 Walking x 5 min 10 Sec 827 ft: PT cues to focus on proper push off and decrease foot eversion  and hip ER - pain  increased to 6/10 Goals assessed, MMT, ROM for MD note Manual: STM to Lt gastoc/soleus, ankle tendons anterior and lateral to reduce soreness in addition to scar massage; retrograde massage to posterior/lateral ankle for edema reduction Cuboid mobs, mid and forefoot mobs, talocrural and calcaneal mobs and PROM of left ankle in all directions   PATIENT EDUCATION:  Education details: [email or web address] Person educated: Patient Education method: Explanation, Verbal cues, and Handouts Education comprehension: verbalized understanding  HOME EXERCISE PROGRAM: Access Code: [email or web address] URL:  [email or web address] Date: 02/27/2023 Prepared by: Raynelle Fanning  Exercises - Ankle Pumps in Elevation  - 2 x daily - 7 x weekly - 2 sets - 10 reps - Ankle Circles in Elevation  - 2 x daily - 7 x weekly - 2 sets - 10 reps - Supine Active Straight Leg Raise  - 2 x daily - 7 x weekly - 2 sets - 10 reps - Sit to Stand  - 2 x daily - 7 x weekly - 3 sets - 5 reps - Seated Long Arc Quad  - 2 x daily - 7 x weekly - 2 sets - 10 reps - Standing Hip Abduction with Counter Support  - 2 x daily - 7 x weekly - 2 sets - 10 reps - Standing Hip Extension with Counter Support  - 2 x daily - 7 x weekly - 2 sets - 10 reps - Seated Ankle Eversion with Resistance  - 1 x daily - 3-4 x weekly - 1 sets - 10 reps - Seated Ankle Inversion with Resistance  - 1 x daily - 3-4 x weekly - 1 sets - 10 reps - Heel Raises with Counter Support  - 1 x daily - 3-4 x weekly - 1 sets - 10 reps - Seated Piriformis Stretch with Trunk Bend  - 2 x daily - 7 x weekly - 1 sets - 3 reps - 30-60 sec  hold - Tandem Stance in Corner  - 1 x daily - 3-4 x weekly - 1 sets - 10 reps  ASSESSMENT:  CLINICAL IMPRESSION: Patient reports decreasing overall pain. Limited ankle dorsiflexion affects ability to descend normal height steps so we continue to work on DF ROM and smaller step heights.  She is challenged with sensory orientation balance ex's on soft surface and with eyes closed as well as single leg stance.  UE support and some min assist/CGA provided by therapist for loss of balance. She reports increased swelling (feeling of tightness in shoe) after exercise but following vasocompression decreased edema noted.    OBJECTIVE IMPAIRMENTS: Abnormal gait, decreased activity tolerance, decreased balance, decreased coordination, decreased mobility, difficulty walking, decreased ROM, decreased strength, hypomobility, increased edema, impaired sensation, and pain.   ACTIVITY LIMITATIONS: standing, squatting, sleeping, stairs, transfers,  and locomotion level  PARTICIPATION LIMITATIONS: cleaning, laundry, shopping, and community activity  PERSONAL FACTORS: Time since onset of injury/illness/exacerbation and 1 comorbidity: 3 surgeries to Lt ankle with complications following ORIF  are also affecting patient's functional outcome.   REHAB POTENTIAL: Excellent  CLINICAL DECISION MAKING: Stable/uncomplicated  EVALUATION COMPLEXITY: Low   GOALS: Goals reviewed with patient? Yes  SHORT TERM GOALS: Target date: 01/15/23 Pt will be ind with initial HEP Baseline: Goal status: met 10/2  2.  Pt will be compliant with edema management using elevation above heart and ice at least 2x/day. Baseline:  Goal status: IN PROGRESS   3.  Pt will improve Lt ankle strength to at least 4/5 Baseline:  Goal status: PARTIALLY MET 10/22  4.  Pt will improve TUG test to 11 sec Baseline:  Goal status: MET 10/7  5.  Pt will be able to perform covering 350' with sneaker and lace up brace Baseline:  Goal status: MET 10/7    LONG TERM GOALS: Target date: 04/15/23  Pt will be ind with advanced HEP Baseline:  Goal status: ONGOING  2.  Pt will achieve at least 4+/5 strength throughout Lt LE for gait, transfers, curbs and functional task performance. Baseline:  Goal status: ONGOING -   3.  Improve FOTO score to at least 57% to demo improved functional performance. Baseline: 52%, 63% Goal status: MET 10/7  4.  Pt will be able to participate in covering 750' in sneaker with lace up brace for improved community outings. Baseline Goal status: PARTIALLY MET 10/22- 5 min 10 sec walked 827 ft  5.  Pt will be able to sleep through night with no or min pain interruption from Lt ankle. Baseline:  Goal status: ONGOING 10/22  6.  Pt will report improved Lt ankle pain to no greater than 6/10 with daily tasks. Baseline:  Goal status: MET 10/7   PLAN:  PT FREQUENCY: 1x/week  PT DURATION: 9 weeks  PLANNED INTERVENTIONS:  Therapeutic exercises, Therapeutic activity, Neuromuscular re-education, Balance training, Gait training, Patient/Family education, Self Care, Stair training, DME instructions, Aquatic Therapy, Dry Needling, Cryotherapy, Taping, Vasopneumatic device, Ionotophoresis 4mg /ml Dexamethasone, and Manual therapy  PLAN FOR NEXT SESSION:  continue with balance,  try ladder agility or trampoline,  modify and progress HEP as needed,  Lt ankle strength  Lavinia Sharps, PT 03/04/23 2:10 PM Phone: [phone] Fax: [phone]  [facility] at [place]  [phone]

## 2023-03-11 ENCOUNTER — Ambulatory Visit: Payer: Medicaid Other | Admitting: Physical Therapy

## 2023-03-19 ENCOUNTER — Ambulatory Visit: Payer: Medicaid Other | Admitting: Physical Therapy

## 2023-03-24 ENCOUNTER — Ambulatory Visit
Admission: RE | Admit: 2023-03-24 | Discharge: 2023-03-24 | Disposition: A | Discharge status: Home / Self Care | Payer: Medicaid Other | Source: Ambulatory Visit | Attending: Internal Medicine | Admitting: Internal Medicine

## 2023-03-24 DIAGNOSIS — Z1231 Encounter for screening mammogram for malignant neoplasm of breast: Secondary | ICD-10-CM

## 2023-03-26 ENCOUNTER — Ambulatory Visit: Payer: Medicaid Other | Attending: Orthopaedic Surgery | Admitting: Physical Therapy

## 2023-03-26 DIAGNOSIS — M6281 Muscle weakness (generalized): Secondary | ICD-10-CM | POA: Diagnosis present

## 2023-03-26 DIAGNOSIS — M25572 Pain in left ankle and joints of left foot: Secondary | ICD-10-CM | POA: Diagnosis present

## 2023-03-26 DIAGNOSIS — R2689 Other abnormalities of gait and mobility: Secondary | ICD-10-CM

## 2023-03-26 NOTE — Therapy (Signed)
OUTPATIENT PHYSICAL THERAPY LOWER EXTREMITY TREATMENT   Patient Name: Ariana Jarvis MRN: [id number] DOB:[birth year], 62 y.o., female Today's Date: 03/26/2023  END OF SESSION:  PT End of Session - 03/26/23 1146     Visit Number 15    Number of Visits 20    Date for PT Re-Evaluation 04/15/23    Authorization Type [employer] -  8 visits+ 4 visits + 8 visits = 20 visits  approved through 1/1    Authorization Time Period 04/15/23    Authorization - Visit Number 15    Authorization - Number of Visits 20    PT Start Time 1147    PT Stop Time 1230    PT Time Calculation (min) 43 min    Activity Tolerance Patient tolerated treatment well               Past Medical History:  Diagnosis Date   Anxiety    Arthritis    back   Back pain    Chronic back pain    Depression    Gastric ulcer    GERD (gastroesophageal reflux disease)    HLD (hyperlipidemia)    Hypertension    Past Surgical History:  Procedure Laterality Date   ANKLE ARTHROSCOPY WITH ARTHRODESIS Left 09/09/2022   Procedure: LEFT TIBIOTALAR JOINT ARTHRODESIS, DISTAL TIBIOFIBULAR JOINT ARTHRODESIS;  Surgeon: Terance Hart, MD;  Location: [facility] OR;  Service: Orthopedics;  Laterality: Left;  LENGTH OF SURGERY: 150 MINUTES   CESAREAN SECTION     x2   CHOLECYSTECTOMY     COLONOSCOPY     HARDWARE REMOVAL Left 05/16/2022   Procedure: HARDWARE REMOVAL LEFT ANKLE;  Surgeon: Joen Laura, MD;  Location: [facility];  Service: Orthopedics;  Laterality: Left;   ORIF ANKLE FRACTURE Left 03/14/2022   Procedure: OPEN REDUCTION INTERNAL FIXATION (ORIF) ANKLE FRACTURE;  Surgeon: Joen Laura, MD;  Location: [facility];  Service: Orthopedics;  Laterality: Left;   ORIF RADIAL FRACTURE Left 03/14/2022   Procedure: RADIAL HEAD ARTHROPLASTY;  Surgeon: Joen Laura, MD;  Location: [facility];  Service: Orthopedics;  Laterality: Left;   SYNDESMOSIS REPAIR Left  09/09/2022   Procedure: OPEN REDUCTION OF SYNDESMOSIS;  Surgeon: Terance Hart, MD;  Location: [facility] OR;  Service: Orthopedics;  Laterality: Left;   Patient Active Problem List   Diagnosis Date Noted   Arthritis of left ankle 09/09/2022    PCP: Quitman Livings, MD  REFERRING PROVIDER: Nicki Guadalajara, MD  REFERRING DIAG: s/p Lt ankle arthrodesis of tibiotalar and tibiofibular joints  THERAPY DIAG:  Pain in left ankle and joints of left foot  Muscle weakness (generalized)  Other abnormalities of gait and mobility  Rationale for Evaluation and Treatment: Rehabilitation  ONSET DATE: 09/09/22 surgery date, had traumatic fracture approx 3 mos before surgery  SUBJECTIVE:   SUBJECTIVE STATEMENT: Overall pain is better but did have some popping in the ankle the last few days and it's sore from that;  when I take my dog out I have to walk uphill and sometimes I turn my ankle; I don't turn my foot out walking as much as I did; my calf is a lot smaller than the other side Goes by [name]   Pt is just over 3 mos s/p Lt tibiotalar and tibiofibular joint arthrodesis performed by Nicki Guadalajara, MD on 09/09/22.  She suffered a Lt ankle fracture with ORIF 03/14/22 and hardware removal from this in Feb 2024.  However, she had a  post-traumatic collapse and avascular necrosis, necessitating subsequent surgery. Pt arrives wearing a walking boot on Lt foot but has been cleared to WB through Lt LE as tolerated.  She has a lace up/velcro brace to wear as needed.   At home I walk on it without the brace.  I wear slippers a lot at home b/c my foot and ankle are too big to put my foot in.  My foot and ankle can swell a lot.  I have numbness along the sides.  I will elevated it throughout the day after walking a lot to help the swelling.  PERTINENT HISTORY: HTN, HLD, back arthritis PAIN:  PAIN:  Are you having pain? Yes NPRS scale: 1/10 Pain location: lateral and dorsal foot   Pain orientation: Left  PAIN TYPE:  aching, dull, sharp, throbbing, and tight Pain description: constant  Aggravating factors: standing and walking Relieving factors: pain meds, rest/elevation, using walking boot   PRECAUTIONS: Fall  RED FLAGS: None   WEIGHT BEARING RESTRICTIONS:  cleared to WB through Lt LE as tolerated  FALLS:  Has patient fallen in last 6 months? Yes. Number of falls knee scooter tipped 1x  LIVING ENVIRONMENT: Lives with: lives with their family and lives alone Lives in: House/apartment Stairs: No Has following equipment at home:  walking boot for Lt foot as needed, has a wheelchair if needs to walk very far  OCCUPATION: unemployed  PLOF: Independent  PATIENT GOALS: get stronger, feel more confident and less pain walking with shoe on vs boot  NEXT MD VISIT: wanted to see her in 6 weeks  OBJECTIVE:   DIAGNOSTIC FINDINGS:   PATIENT SURVEYS:  FOTO: 63% MET GOAL FOTO 52% goal 57% 10/30 51%  COGNITION: Overall cognitive status: Within functional limits for tasks assessed     SENSATION: Numbness along sides of Lt ankle since surgery medial> lateral  EDEMA:  10/17: Figure 8: Rt 54.5cm, Lt 58.5cm  Figure 8: Lt 55cm, Rt 51cm  MUSCLE LENGTH: Lt Gastroc limited 50% WNL hips and knees bil  POSTURE: No Significant postural limitations  PALPATION: Both tender and numb along medial and lateral aspects of ankle and foot  LOWER EXTREMITY ROM: 10/30:  Left ankle  active ROM: PF 50 DF 2 active, 6 passive Inv 22 Ever 30   02/03/23 A/P ROM L ankle   PF 53/63 DF -6/0 Inv 25/34  pain Ever 30/24  01/27/23: Lt 10 DF, 12 PF  Eval: Lt ankle 0 deg DF, 41 deg PF Rt ankle 12 deg DF, 55 deg PF   LOWER EXTREMITY MMT: 10/22  DF 5/5, Ever and Inv 4+/5, PF 4-/5 (tested in sitting) painful  10/17: Lt knee 5/5, Lt ankle 4/5   Bil hips 5/5  Rt knee 5/5 Lt knee 4/5  Rt ankle 5/5  Lt ankle 3+/5 all cardinal planes of motion    FUNCTIONAL TESTS:  10/22: (stopped at 5 min  and 10 sec due to pain) 827 ft.  10/17: 5x STS: 9.96 TUG: 9.62 3 MWT: 599'  10/7:  5x STS: 10.41 TUG: 9.92 3 min walk test: 563'  Eval: 5 times sit to stand: 15.13 hands on thighs Timed up and go (TUG): 12.95 wearing walking boot 2 minute walk test: 17' with Lt walking boot  GAIT: Distance walked: 312' Assistive device utilized:  wearing Lt walking boot Level of assistance: Modified independence Comments: some lateral ankle weakness within boot  10/22: decreased stance time left due to decreased DF, increased hip ER  TODAY'S TREATMENT:                                                                                                                              DATE:  03/26/23 NuStep L1 10 min while discussing status Slant board calf stretch 20 sec hold 3x (added to HEP) Wall runners calf stretch 20 sec 3x (added to HEP) Bil standing heel raises 10x (added to HEP) WB on left with right hip 4 ways (without UE support) (added to HEP) 2 inch forward step up and overs 10x (added to HEP) Calf press 15# left 2x 10 (pt has leg press machine in apartment complex) verbal and tactile cues to keep knee straight Vasocompression left ankle coldest setting, moderate compression 10 min with elevation   03/04/23 NuStep L5 8 min Seated heel raise 10# 25x Seated yellow loop ankle eversion 20x Heel raises with small ball squeeze between heels 10x Slant board calf stretch 20 sec holds 5x Lunge stretch in chair 10x 2 inch forward step ups, lateral step ups, step downs 5x each 2 rounds WB on left with circles taps with right (decreasing UE support but min assist at times to recover loss of balance) Balance on foam: with weight shifts, UE movements, head movements, narrow base of support, eyes closed Vasocompression left ankle coldest setting, moderate compression 10 min with elevation   02/27/23 Bike x 5  while discussing status and DN option Seated heel raise 10# 25x Ankle ROM Seated  ankle DF with red band 40 reps Seated ankle Inv red x 20 reps SLS on left with cone taps intermittent UE support 6 inch FW step ups 20x Up and over 4 inch step x 10 worked on controlling descent Tandem walking at [employer] x 8 laps  Foam: standing normal stance, narrow BOS EO/EC, step ups x 10 with L Tandem stance in corner B for HEP  02/11/23 Nu-Step L3 15 min while discussing status and DN option Seated heel raise 10# 25x Seated ankle DF with red band FOTO Ankle ROM 2nd step rocking for ankle DF (added red band at talocrual joint for mobilization-no change in pain) WB on left with tap steps no UE support needed 6 inch FW step ups 10x Up and over 4 inch step (very challenging to descend)  Trigger Point Dry-Needling  Treatment instructions: Expect mild to moderate muscle soreness. S/S of pneumothorax if dry needled over a lung field, and to seek immediate medical attention should they occur. Patient verbalized understanding of these instructions and education.  Patient Consent Given: Yes Education handout provided: Yes Muscles treated: left gastroc and soleus Electrical stimulation performed: No Parameters: N/A Treatment response/outcome: improved soft tissue mobility   Heat 2 min following  02/05/23 Walking x75min - stop due to hip pain and some ankle pain  Seated fig 4 2x30 sec Curb negotiation outside - up with left down with R 4 inch step ups fwd x 10, lateral x 10; 6  inch lateral x 10 Seated Lt heel raise, 10# on thigh x20 Seated DF x20 Seated DF and eversion with band yellow x 20 Seated PF Red Tband x 20 Lt ankle slides into PF/DF 2x10 Step downs 2 inch x 10 Manual: manual resorption for edema, PROM all planes Ice x 10 min to L ankle at end of session   PATIENT EDUCATION:  Education details: [email or web address] Person educated: Patient Education method: Explanation, Verbal cues, and Handouts Education comprehension: verbalized understanding  HOME EXERCISE PROGRAM: Access  Code: [email or web address] URL: [email or web address] Date: 03/26/2023 Prepared by: Lavinia Sharps  Exercises - Ankle Pumps in Elevation  - 2 x daily - 7 x weekly - 2 sets - 10 reps - Ankle Circles in Elevation  - 2 x daily - 7 x weekly - 2 sets - 10 reps - Supine Active Straight Leg Raise  - 2 x daily - 7 x weekly - 2 sets - 10 reps - Sit to Stand  - 2 x daily - 7 x weekly - 3 sets - 5 reps - Seated Long Arc Quad  - 2 x daily - 7 x weekly - 2 sets - 10 reps - Standing Hip Abduction with Counter Support  - 2 x daily - 7 x weekly - 2 sets - 10 reps - Standing Hip Extension with Counter Support  - 2 x daily - 7 x weekly - 2 sets - 10 reps - Seated Ankle Eversion with Resistance  - 1 x daily - 3-4 x weekly - 1 sets - 10 reps - Seated Ankle Inversion with Resistance  - 1 x daily - 3-4 x weekly - 1 sets - 10 reps - Heel Raises with Counter Support  - 1 x daily - 3-4 x weekly - 1 sets - 10 reps - Seated Piriformis Stretch with Trunk Bend  - 2 x daily - 7 x weekly - 1 sets - 3 reps - 30-60 sec  hold - Tandem Stance in Corner  - 1 x daily - 3-4 x weekly - 1 sets - 10 reps - Gastroc Stretch on Wall  - 1 x daily - 7 x weekly - 1 sets - 3 reps - 30 hold - Gastroc Stretch with Foot at Wall  - 1 x daily - 7 x weekly - 1 sets - 3 reps - 30 hold - Standing Hip Flexion AROM (Mirrored)  - 1 x daily - 7 x weekly - 1 sets - 10 reps - Heel Raises with Counter Support  - 3 x daily - 7 x weekly - 1 sets - 10 reps - Forward Step Up (Mirrored)  - 1 x daily - 7 x weekly - 1 sets - 10 reps - Eccentric Single Heel Raises to Dorsiflexion with Leg Press 1 Up, 1 Down  - 1 x daily - 7 x weekly - 1 sets - 10 reps  ASSESSMENT:  CLINICAL IMPRESSION: Therapist progressing and updating HEP for increased intensity and challenge level for further strengthening, proprioception and functional mobility.  She is limited with ankle dorsiflexion ROM which affects ability to ascend and descend steps.  She reports improved function  overall including walking her dog on the hill outside her apartment but with some instability noted. Anticipate readiness for discharge from PT next visit to a HEP to address these ongoing limitations and to return to highest functional level. Vasocompression decreases pain and swelling post exercise.     OBJECTIVE IMPAIRMENTS: Abnormal gait, decreased activity tolerance, decreased balance, decreased coordination,  decreased mobility, difficulty walking, decreased ROM, decreased strength, hypomobility, increased edema, impaired sensation, and pain.   ACTIVITY LIMITATIONS: standing, squatting, sleeping, stairs, transfers, and locomotion level  PARTICIPATION LIMITATIONS: cleaning, laundry, shopping, and community activity  PERSONAL FACTORS: Time since onset of injury/illness/exacerbation and 1 comorbidity: 3 surgeries to Lt ankle with complications following ORIF  are also affecting patient's functional outcome.   REHAB POTENTIAL: Excellent  CLINICAL DECISION MAKING: Stable/uncomplicated  EVALUATION COMPLEXITY: Low   GOALS: Goals reviewed with patient? Yes  SHORT TERM GOALS: Target date: 01/15/23 Pt will be ind with initial HEP Baseline: Goal status: met 10/2  2.  Pt will be compliant with edema management using elevation above heart and ice at least 2x/day. Baseline:  Goal status: IN PROGRESS   3.  Pt will improve Lt ankle strength to at least 4/5 Baseline:  Goal status: PARTIALLY MET 10/22  4.  Pt will improve TUG test to 11 sec Baseline:  Goal status: MET 10/7  5.  Pt will be able to perform covering 350' with sneaker and lace up brace Baseline:  Goal status: MET 10/7    LONG TERM GOALS: Target date: 04/15/23  Pt will be ind with advanced HEP Baseline:  Goal status: ONGOING  2.  Pt will achieve at least 4+/5 strength throughout Lt LE for gait, transfers, curbs and functional task performance. Baseline:  Goal status: ONGOING -   3.  Improve FOTO score to at  least 57% to demo improved functional performance. Baseline: 52%, 63% Goal status: MET 10/7  4.  Pt will be able to participate in covering 750' in sneaker with lace up brace for improved community outings. Baseline Goal status: PARTIALLY MET 10/22- 5 min 10 sec walked 827 ft  5.  Pt will be able to sleep through night with no or min pain interruption from Lt ankle. Baseline:  Goal status: ONGOING 10/22  6.  Pt will report improved Lt ankle pain to no greater than 6/10 with daily tasks. Baseline:  Goal status: MET 10/7   PLAN:  PT FREQUENCY: 1x/week  PT DURATION: 9 weeks  PLANNED INTERVENTIONS: Therapeutic exercises, Therapeutic activity, Neuromuscular re-education, Balance training, Gait training, Patient/Family education, Self Care, Stair training, DME instructions, Aquatic Therapy, Dry Needling, Cryotherapy, Taping, Vasopneumatic device, Ionotophoresis 4mg /ml Dexamethasone, and Manual therapy  PLAN FOR NEXT SESSION:  expected discharge next visit; check goals; 6 MWT; review HEP    Lavinia Sharps, PT 03/26/23 12:51 PM Phone: [phone] Fax: [phone]  [facility]  [phone]

## 2023-04-02 ENCOUNTER — Ambulatory Visit: Payer: Medicaid Other | Admitting: Physical Therapy

## 2023-04-02 DIAGNOSIS — M25572 Pain in left ankle and joints of left foot: Secondary | ICD-10-CM

## 2023-04-02 DIAGNOSIS — M6281 Muscle weakness (generalized): Secondary | ICD-10-CM

## 2023-04-02 DIAGNOSIS — R2689 Other abnormalities of gait and mobility: Secondary | ICD-10-CM

## 2023-04-02 NOTE — Therapy (Signed)
OUTPATIENT PHYSICAL THERAPY LOWER EXTREMITY TREATMENT/DISCHARGE SUMMARY   Patient Name: Ariana Jarvis MRN: [id number] DOB:1960-06-28, 62 y.o., female Today's Date: 04/02/2023  END OF SESSION:  PT End of Session - 04/02/23 1105     Visit Number 16    Number of Visits 20    Date for PT Re-Evaluation 04/15/23    Authorization Type [employer] -  8 visits+ 4 visits + 8 visits = 20 visits  approved through 1/1    Authorization - Number of Visits 20    PT Start Time 1103    PT Stop Time 1145    PT Time Calculation (min) 42 min    Activity Tolerance Patient tolerated treatment well               Past Medical History:  Diagnosis Date   Anxiety    Arthritis    back   Back pain    Chronic back pain    Depression    Gastric ulcer    GERD (gastroesophageal reflux disease)    HLD (hyperlipidemia)    Hypertension    Past Surgical History:  Procedure Laterality Date   ANKLE ARTHROSCOPY WITH ARTHRODESIS Left 09/09/2022   Procedure: LEFT TIBIOTALAR JOINT ARTHRODESIS, DISTAL TIBIOFIBULAR JOINT ARTHRODESIS;  Surgeon: Terance Hart, MD;  Location: [facility] OR;  Service: Orthopedics;  Laterality: Left;  LENGTH OF SURGERY: 150 MINUTES   CESAREAN SECTION     x2   CHOLECYSTECTOMY     COLONOSCOPY     HARDWARE REMOVAL Left 05/16/2022   Procedure: HARDWARE REMOVAL LEFT ANKLE;  Surgeon: Joen Laura, MD;  Location: [facility];  Service: Orthopedics;  Laterality: Left;   ORIF ANKLE FRACTURE Left 03/14/2022   Procedure: OPEN REDUCTION INTERNAL FIXATION (ORIF) ANKLE FRACTURE;  Surgeon: Joen Laura, MD;  Location: [facility];  Service: Orthopedics;  Laterality: Left;   ORIF RADIAL FRACTURE Left 03/14/2022   Procedure: RADIAL HEAD ARTHROPLASTY;  Surgeon: Joen Laura, MD;  Location: [facility];  Service: Orthopedics;  Laterality: Left;   SYNDESMOSIS REPAIR Left 09/09/2022   Procedure: OPEN REDUCTION OF SYNDESMOSIS;   Surgeon: Terance Hart, MD;  Location: [facility] OR;  Service: Orthopedics;  Laterality: Left;   Patient Active Problem List   Diagnosis Date Noted   Arthritis of left ankle 09/09/2022    PCP: Ariana Livings, MD  REFERRING PROVIDER: Nicki Guadalajara, MD  REFERRING DIAG: s/p Lt ankle arthrodesis of tibiotalar and tibiofibular joints  THERAPY DIAG:  Pain in left ankle and joints of left foot  Muscle weakness (generalized)  Other abnormalities of gait and mobility  Rationale for Evaluation and Treatment: Rehabilitation  ONSET DATE: 09/09/22 surgery date, had traumatic fracture approx 3 mos before surgery  SUBJECTIVE:   SUBJECTIVE STATEMENT: My foot has been hurting ever since that pop.  It's kind of a constant but not as bad as when I'm sitting. I had an x-ray and he said it looked fine and it could be the screws (could be removed).  Goes by Ariana Jarvis   Pt is just over 3 mos s/p Lt tibiotalar and tibiofibular joint arthrodesis performed by Nicki Guadalajara, MD on 09/09/22.  She suffered a Lt ankle fracture with ORIF 03/14/22 and hardware removal from this in Feb 2024.  However, she had a post-traumatic collapse and avascular necrosis, necessitating subsequent surgery. Pt arrives wearing a walking boot on Lt foot but has been cleared to WB through Lt LE as tolerated.  She has a  lace up/velcro brace to wear as needed.   At home I walk on it without the brace.  I wear slippers a lot at home b/c my foot and ankle are too big to put my foot in.  My foot and ankle can swell a lot.  I have numbness along the sides.  I will elevated it throughout the day after walking a lot to help the swelling.  PERTINENT HISTORY: HTN, HLD, back arthritis PAIN:  PAIN:  Are you having pain? Yes NPRS scale: now 2/10  but sometimes it's a 6/10 Pain location: lateral and dorsal foot   Pain orientation: Left  PAIN TYPE: aching, dull, sharp, throbbing, and tight Pain description: constant  Aggravating factors: standing  and walking Relieving factors: pain meds, rest/elevation, using walking boot   PRECAUTIONS: Fall  RED FLAGS: None   WEIGHT BEARING RESTRICTIONS:  cleared to WB through Lt LE as tolerated  FALLS:  Has patient fallen in last 6 months? Yes. Number of falls knee scooter tipped 1x  LIVING ENVIRONMENT: Lives with: lives with their family and lives alone Lives in: House/apartment Stairs: No Has following equipment at home:  walking boot for Lt foot as needed, has a wheelchair if needs to walk very far  OCCUPATION: unemployed  PLOF: Independent  PATIENT GOALS: get stronger, feel more confident and less pain walking with shoe on vs boot  NEXT MD VISIT: wanted to see her in 6 weeks  OBJECTIVE:   DIAGNOSTIC FINDINGS:   PATIENT SURVEYS:  FOTO: 63% MET GOAL FOTO 52% goal 57% 10/30 51% 12/19: 66% goal met  COGNITION: Overall cognitive status: Within functional limits for tasks assessed     SENSATION: Numbness along sides of Lt ankle since surgery medial> lateral  EDEMA:  10/17: Figure 8: Rt 54.5cm, Lt 58.5cm  Figure 8: Lt 55cm, Rt 51cm  MUSCLE LENGTH: Lt Gastroc limited 50% WNL hips and knees bil  POSTURE: No Significant postural limitations  PALPATION: Both tender and numb along medial and lateral aspects of ankle and foot  LOWER EXTREMITY ROM: 12/19:  Left ankle  active ROM: PF 45 DF 5 active, 8 passive Inv 14 Ever 18   10/30:  Left ankle  active ROM: PF 50 DF 2 active, 6 passive Inv 22 Ever 30   02/03/23 A/P ROM L ankle   PF 53/63 DF -6/0 Inv 25/34  pain Ever 30/24  01/27/23: Lt 10 DF, 12 PF  Eval: Lt ankle 0 deg DF, 41 deg PF Rt ankle 12 deg DF, 55 deg PF   LOWER EXTREMITY MMT: 12/19: DF 5/5, Ever and Inv 4+/5, PF 4-/5, minimal heel lift height in standing 10/22  DF 5/5, Ever and Inv 4+/5, PF 4-/5 (tested in sitting) painful  10/17: Lt knee 5/5, Lt ankle 4/5   Bil hips 5/5  Rt knee 5/5 Lt knee 4/5  Rt ankle 5/5  Lt ankle 3+/5  all cardinal planes of motion    FUNCTIONAL TESTS:  10/22: (stopped at 5 min and 10 sec due to pain) 827 ft.  10/17: 5x STS: 9.96 TUG: 9.62 3 MWT: 599'  10/7:  5x STS: 10.41 TUG: 9.92 3 min walk test: 563'  Eval: 5 times sit to stand: 15.13 hands on thighs Timed up and go (TUG): 12.95 wearing walking boot 2 minute walk test: 90' with Lt walking boot  GAIT: Distance walked: 312' Assistive device utilized:  wearing Lt walking boot Level of assistance: Modified independence Comments: some lateral ankle weakness within boot  10/22: decreased stance time left due to decreased DF, increased hip ER    12/19: 6 MWT 1072  bil LBP not foot pain  TODAY'S TREATMENT:                                                                                                                              DATE:  03/26/23 NuStep L1 10 min while discussing status FOTO 6 MWT Ankle ROM SLS Heel raise test in standing Review of HEP and areas on emphasis based on objective measures   03/26/23 NuStep L1 10 min while discussing status Slant board calf stretch 20 sec hold 3x (added to HEP) Wall runners calf stretch 20 sec 3x (added to HEP) Bil standing heel raises 10x (added to HEP) WB on left with right hip 4 ways (without UE support) (added to HEP) 2 inch forward step up and overs 10x (added to HEP) Calf press 15# left 2x 10 (pt has leg press machine in apartment complex) verbal and tactile cues to keep knee straight Vasocompression left ankle coldest setting, moderate compression 10 min with elevation   03/04/23 NuStep L5 8 min Seated heel raise 10# 25x Seated yellow loop ankle eversion 20x Heel raises with small ball squeeze between heels 10x Slant board calf stretch 20 sec holds 5x Lunge stretch in chair 10x 2 inch forward step ups, lateral step ups, step downs 5x each 2 rounds WB on left with circles taps with right (decreasing UE support but min assist at times to recover loss of  balance) Balance on foam: with weight shifts, UE movements, head movements, narrow base of support, eyes closed Vasocompression left ankle coldest setting, moderate compression 10 min with elevation   02/27/23 Bike x 5  while discussing status and DN option Seated heel raise 10# 25x Ankle ROM Seated ankle DF with red band 40 reps Seated ankle Inv red x 20 reps SLS on left with cone taps intermittent UE support 6 inch FW step ups 20x Up and over 4 inch step x 10 worked on controlling descent Tandem walking at [employer] x 8 laps  Foam: standing normal stance, narrow BOS EO/EC, step ups x 10 with L Tandem stance in corner B for HEP    PATIENT EDUCATION:  Education details: [email or web address] Person educated: Patient Education method: Programmer, multimedia, Verbal cues, and Handouts Education comprehension: verbalized understanding  HOME EXERCISE PROGRAM: Access Code: [email or web address] URL: [email or web address] Date: 03/26/2023 Prepared by: Lavinia Sharps  Exercises - Ankle Pumps in Elevation  - 2 x daily - 7 x weekly - 2 sets - 10 reps - Ankle Circles in Elevation  - 2 x daily - 7 x weekly - 2 sets - 10 reps - Supine Active Straight Leg Raise  - 2 x daily - 7 x weekly - 2 sets - 10 reps - Sit to Stand  - 2 x daily - 7 x weekly - 3 sets - 5 reps - Seated Long Arc Quad  -  2 x daily - 7 x weekly - 2 sets - 10 reps - Standing Hip Abduction with Counter Support  - 2 x daily - 7 x weekly - 2 sets - 10 reps - Standing Hip Extension with Counter Support  - 2 x daily - 7 x weekly - 2 sets - 10 reps - Seated Ankle Eversion with Resistance  - 1 x daily - 3-4 x weekly - 1 sets - 10 reps - Seated Ankle Inversion with Resistance  - 1 x daily - 3-4 x weekly - 1 sets - 10 reps - Heel Raises with Counter Support  - 1 x daily - 3-4 x weekly - 1 sets - 10 reps - Seated Piriformis Stretch with Trunk Bend  - 2 x daily - 7 x weekly - 1 sets - 3 reps - 30-60 sec  hold - Tandem Stance in Corner  - 1 x daily - 3-4 x  weekly - 1 sets - 10 reps - Gastroc Stretch on Wall  - 1 x daily - 7 x weekly - 1 sets - 3 reps - 30 hold - Gastroc Stretch with Foot at Wall  - 1 x daily - 7 x weekly - 1 sets - 3 reps - 30 hold - Standing Hip Flexion AROM (Mirrored)  - 1 x daily - 7 x weekly - 1 sets - 10 reps - Heel Raises with Counter Support  - 3 x daily - 7 x weekly - 1 sets - 10 reps - Forward Step Up (Mirrored)  - 1 x daily - 7 x weekly - 1 sets - 10 reps - Eccentric Single Heel Raises to Dorsiflexion with Leg Press 1 Up, 1 Down  - 1 x daily - 7 x weekly - 1 sets - 10 reps  ASSESSMENT:  CLINICAL IMPRESSION: The patient has met the majority of rehab goals, with noted improvements in pain reduction, outcome score, ROM, strength and functional mobility.  A comprehensive HEP has been established and anticipate further improvements over time with regular performance of the program.  Recommend discharge from PT at this time.   OBJECTIVE IMPAIRMENTS: Abnormal gait, decreased activity tolerance, decreased balance, decreased coordination, decreased mobility, difficulty walking, decreased ROM, decreased strength, hypomobility, increased edema, impaired sensation, and pain.   ACTIVITY LIMITATIONS: standing, squatting, sleeping, stairs, transfers, and locomotion level  PARTICIPATION LIMITATIONS: cleaning, laundry, shopping, and community activity  PERSONAL FACTORS: Time since onset of injury/illness/exacerbation and 1 comorbidity: 3 surgeries to Lt ankle with complications following ORIF  are also affecting patient's functional outcome.   REHAB POTENTIAL: Excellent  CLINICAL DECISION MAKING: Stable/uncomplicated  EVALUATION COMPLEXITY: Low   GOALS: Goals reviewed with patient? Yes  SHORT TERM GOALS: Target date: 01/15/23 Pt will be ind with initial HEP Baseline: Goal status: met 10/2  2.  Pt will be compliant with edema management using elevation above heart and ice at least 2x/day. Baseline:  Goal status: met   3.   Pt will improve Lt ankle strength to at least 4/5 Baseline:  Goal status: met 4.  Pt will improve TUG test to 11 sec Baseline:  Goal status: MET 10/7  5.  Pt will be able to perform covering 350' with sneaker and lace up brace Baseline:  Goal status: MET 10/7    LONG TERM GOALS: Target date: 04/15/23  Pt will be ind with advanced HEP Baseline:  Goal status: met  2.  Pt will achieve at least 4+/5 strength throughout Lt LE for gait, transfers,  curbs and functional task performance. Baseline:  Goal status: partially met  3.  Improve FOTO score to at least 57% to demo improved functional performance. Baseline: 52%, 63% Goal status: MET 10/7  4.  Pt will be able to participate in covering 750' in sneaker with lace up brace for improved community outings. Baseline Goal status: met 12/19  5.  Pt will be able to sleep through night with no or min pain interruption from Lt ankle. Baseline:  Goal status: partially met--since popped waking me up more 6.  Pt will report improved Lt ankle pain to no greater than 6/10 with daily tasks. Baseline:  Goal status: MET 10/7   PLAN: PHYSICAL THERAPY DISCHARGE SUMMARY  Visits from Start of Care: 16  Current functional level related to goals / functional outcomes: See clinical impressions above   Remaining deficits: As above   Education / Equipment: HEP   Patient agrees to discharge. Patient goals were partially met. Patient is being discharged due to being pleased with the current functional level.  Lavinia Sharps, PT 04/02/23 8:31 PM Phone: [phone] Fax: [phone]

## 2023-05-11 ENCOUNTER — Encounter: Payer: Self-pay | Admitting: *Deleted

## 2023-05-11 ENCOUNTER — Ambulatory Visit (INDEPENDENT_AMBULATORY_CARE_PROVIDER_SITE_OTHER): Payer: Medicaid Other

## 2023-05-11 ENCOUNTER — Other Ambulatory Visit: Payer: Self-pay

## 2023-05-11 ENCOUNTER — Ambulatory Visit
Admission: EM | Admit: 2023-05-11 | Discharge: 2023-05-11 | Disposition: A | Discharge status: Home / Self Care | Payer: Medicaid Other | Attending: Physician Assistant | Admitting: Physician Assistant

## 2023-05-11 DIAGNOSIS — J209 Acute bronchitis, unspecified: Secondary | ICD-10-CM | POA: Diagnosis not present

## 2023-05-11 DIAGNOSIS — J440 Chronic obstructive pulmonary disease with acute lower respiratory infection: Secondary | ICD-10-CM

## 2023-05-11 DIAGNOSIS — R051 Acute cough: Secondary | ICD-10-CM

## 2023-05-11 LAB — POC COVID19/FLU A&B COMBO
Covid Antigen, POC: NEGATIVE
Influenza A Antigen, POC: NEGATIVE
Influenza B Antigen, POC: NEGATIVE

## 2023-05-11 MED ORDER — AMOXICILLIN-POT CLAVULANATE 875-125 MG PO TABS: 1.0000 | ORAL_TABLET | Freq: Two times a day (BID) | ORAL | 0 refills | Status: AC

## 2023-05-11 MED ORDER — PROMETHAZINE-DM 6.25-15 MG/5ML PO SYRP: 5.0000 mL | ORAL_SOLUTION | Freq: Two times a day (BID) | ORAL | 0 refills | Status: AC | PRN

## 2023-05-11 MED ORDER — ALBUTEROL SULFATE HFA 108 (90 BASE) MCG/ACT IN AERS: 1.0000 | INHALATION_SPRAY | Freq: Four times a day (QID) | RESPIRATORY_TRACT | 0 refills | Status: AC | PRN

## 2023-05-11 MED ORDER — PREDNISONE 20 MG PO TABS: 40.0000 mg | ORAL_TABLET | Freq: Every day | ORAL | 0 refills | Status: AC

## 2023-05-11 NOTE — ED Provider Notes (Signed)
[facility]    CSN: [id number] Arrival date & time: 05/11/23  1643      History   Chief Complaint Chief Complaint  Patient presents with   Cough    HPI Ariana Jarvis is a 63 y.o. female.   Patient presents today with 5-day history of URI symptoms.  Reports cough, increased pedal production, sore throat, congestion.  Denies any fever, chest pain, shortness of breath.  She reports that her significant other has also had similar symptoms but was not diagnosed with any viral illnesses.  Denies additional known sick contacts.  She has been taking over-the-counter medication without improvement of symptoms.  She denies any recent antibiotics or steroids.  She denies history of asthma or COPD but is a former smoker.  She quit smoking several years ago; reports she quit approximately 15 or 20 years ago but has a 30+ pack year history.  She denies any history of diabetes.  She is concerned because the congestion has gotten thicker and she is having a difficult time coughing it up.    Past Medical History:  Diagnosis Date   Anxiety    Arthritis    back   Back pain    Chronic back pain    Depression    Gastric ulcer    GERD (gastroesophageal reflux disease)    HLD (hyperlipidemia)    Hypertension     Patient Active Problem List   Diagnosis Date Noted   Arthritis of left ankle 09/09/2022    Past Surgical History:  Procedure Laterality Date   ANKLE ARTHROSCOPY WITH ARTHRODESIS Left 09/09/2022   Procedure: LEFT TIBIOTALAR JOINT ARTHRODESIS, DISTAL TIBIOFIBULAR JOINT ARTHRODESIS;  Surgeon: Terance Hart, MD;  Location: [facility] OR;  Service: Orthopedics;  Laterality: Left;  LENGTH OF SURGERY: 150 MINUTES   CESAREAN SECTION     x2   CHOLECYSTECTOMY     COLONOSCOPY     HARDWARE REMOVAL Left 05/16/2022   Procedure: HARDWARE REMOVAL LEFT ANKLE;  Surgeon: Joen Laura, MD;  Location: [facility];  Service: Orthopedics;  Laterality: Left;   ORIF  ANKLE FRACTURE Left 03/14/2022   Procedure: OPEN REDUCTION INTERNAL FIXATION (ORIF) ANKLE FRACTURE;  Surgeon: Joen Laura, MD;  Location: [facility];  Service: Orthopedics;  Laterality: Left;   ORIF RADIAL FRACTURE Left 03/14/2022   Procedure: RADIAL HEAD ARTHROPLASTY;  Surgeon: Joen Laura, MD;  Location: [facility];  Service: Orthopedics;  Laterality: Left;   SYNDESMOSIS REPAIR Left 09/09/2022   Procedure: OPEN REDUCTION OF SYNDESMOSIS;  Surgeon: Terance Hart, MD;  Location: [facility];  Service: Orthopedics;  Laterality: Left;    OB History   No obstetric history on file.      Home Medications    Prior to Admission medications   Medication Sig Start Date End Date Taking? Authorizing Provider  albuterol (VENTOLIN HFA) 108 (90 Base) MCG/ACT inhaler Inhale 1-2 puffs into the lungs every 6 (six) hours as needed for wheezing or shortness of breath. 05/11/23  Yes Emiel Kielty K, PA-C  atenolol (TENORMIN) 100 MG tablet Take 100 mg by mouth daily.   Yes [provider]  citalopram (CELEXA) 40 MG tablet Take 40 mg by mouth daily.   Yes [provider]  hydrALAZINE (APRESOLINE) 50 MG tablet Take 50 mg by mouth in the morning and at bedtime.   Yes [provider]  lisinopril-hydrochlorothiazide (ZESTORETIC) 20-12.5 MG tablet Take 1 tablet by mouth daily.   Yes [provider]  meloxicam (MOBIC) 15 MG tablet Take 15 mg by mouth daily.   Yes [provider]  omeprazole (PRILOSEC) 40 MG capsule Take 40 mg by mouth daily.   Yes [provider]  oxyCODONE (ROXICODONE) 15 MG immediate release tablet Take 15 mg by mouth every 4 (four) hours as needed for pain.   Yes [provider]  predniSONE (DELTASONE) 20 MG tablet Take 2 tablets (40 mg total) by mouth daily for 5 days. 05/11/23 05/16/23 Yes Jenayah Antu K, PA-C  promethazine-dextromethorphan (PROMETHAZINE-DM) 6.25-15 MG/5ML syrup Take 5 mLs by  mouth 2 (two) times daily as needed for cough. 05/11/23  Yes Jamear Carbonneau K, PA-C  rosuvastatin (CRESTOR) 5 MG tablet Take 5 mg by mouth daily.   Yes [provider]  solifenacin (VESICARE) 10 MG tablet Take 10 mg by mouth daily. 08/08/19  Yes [provider]  Vitamin D, Ergocalciferol, (DRISDOL) 1.25 MG (50000 UNIT) CAPS capsule Take 50,000 Units by mouth once a week.   Yes [provider]  amoxicillin-clavulanate (AUGMENTIN) 875-125 MG tablet Take 1 tablet by mouth every 12 (twelve) hours. 05/11/23   Kaitlyne Friedhoff, Noberto Retort, PA-C  aspirin (BAYER ASPIRIN) 325 MG tablet Take 1 tablet by mouth for 30 DAYS for blood clot prevention Patient not taking: Reported on 05/11/2023 09/10/22   [place], Jesse J, PA-C  chlorhexidine (HIBICLENS) 4 % external liquid Apply 15 mLs (1 Application total) topically as directed for 30 doses. Use as directed daily for 5 days every other week for 6 weeks. 09/09/22   [place], Jesse J, PA-C  methocarbamol (ROBAXIN-750) 750 MG tablet Take 1 tablet (750 mg total) by mouth every 8 (eight) hours as needed for muscle spasms. 09/10/22   [place], Jesse J, PA-C  Multiple Vitamins-Minerals (HAIR SKIN & NAILS) TABS Take 2 tablets by mouth daily.    [provider]  oxymorphone (OPANA ER) 10 MG 12 hr tablet Take 10 mg by mouth in the morning, at noon, and at bedtime. Patient not taking: Reported on 09/03/2022    [provider]    Family History Family History  Problem Relation Age of Onset   Hypertension Mother    Hypertension Father    Cancer Father        lung     Social History Social History   Tobacco Use   Smoking status: Former    Types: Cigarettes   Smokeless tobacco: Never  Vaping Use   Vaping status: Never Used  Substance Use Topics   Alcohol use: Not Currently   Drug use: Never     Allergies   Tramadol   Review of Systems Review of Systems  Constitutional:  Positive for activity change. Negative for appetite change,  fatigue and fever.  HENT:  Positive for congestion, sinus pressure and sore throat. Negative for postnasal drip and sneezing.   Respiratory:  Positive for cough, chest tightness and shortness of breath. Negative for wheezing.   Cardiovascular:  Negative for chest pain.  Gastrointestinal:  Negative for abdominal pain, diarrhea, nausea and vomiting.  Musculoskeletal:  Negative for arthralgias and myalgias.  Neurological:  Negative for dizziness, light-headedness and headaches.     Physical Exam Triage Vital Signs ED Triage Vitals  Encounter Vitals Group     BP 05/11/23 1905 (!) 147/81     Systolic BP Percentile --      Diastolic BP Percentile --      Pulse Rate 05/11/23 1905 92     Resp 05/11/23 1905  14     Temp 05/11/23 1905 98.9 F (37.2 C)     Temp Source 05/11/23 1905 Oral     SpO2 05/11/23 1905 95 %     Weight --      Height --      Head Circumference --      Peak Flow --      Pain Score 05/11/23 1918 5     Pain Loc --      Pain Education --      Exclude from Growth Chart --    No data found.  Updated Vital Signs BP (!) 147/81 (BP Location: Right Arm)   Pulse 92   Temp 98.9 F (37.2 C) (Oral)   Resp 14   SpO2 95%   Visual Acuity Right Eye Distance:   Left Eye Distance:   Bilateral Distance:    Right Eye Near:   Left Eye Near:    Bilateral Near:     Physical Exam Vitals reviewed.  Constitutional:      General: She is awake. She is not in acute distress.    Appearance: Normal appearance. She is well-developed. She is not ill-appearing.     Comments: Very pleasant female appears stated age in no acute distress sitting comfortably in exam room  HENT:     Head: Normocephalic and atraumatic.     Right Ear: Tympanic membrane, ear canal and external ear normal. Tympanic membrane is not erythematous or bulging.     Left Ear: Tympanic membrane, ear canal and external ear normal. Tympanic membrane is not erythematous or bulging.     Nose: Nose normal.     Right  Sinus: No maxillary sinus tenderness or frontal sinus tenderness.     Left Sinus: No maxillary sinus tenderness or frontal sinus tenderness.     Mouth/Throat:     Pharynx: Uvula midline. No oropharyngeal exudate or posterior oropharyngeal erythema.  Cardiovascular:     Rate and Rhythm: Normal rate and regular rhythm.     Heart sounds: Normal heart sounds, S1 normal and S2 normal. No murmur heard. Pulmonary:     Effort: Pulmonary effort is normal.     Breath sounds: Examination of the right-lower field reveals decreased breath sounds. Examination of the left-lower field reveals decreased breath sounds. Decreased breath sounds present. No wheezing, rhonchi or rales.  Psychiatric:        Behavior: Behavior is cooperative.      UC Treatments / Results  Labs (all labs ordered are listed, but only abnormal results are displayed) Labs Reviewed  POC COVID19/FLU A&B COMBO - Normal    EKG   Radiology No results found.  Procedures Procedures (including critical care time)  Medications Ordered in UC Medications - No data to display  Initial Impression / Assessment and Plan / UC Course  I have reviewed the triage vital signs and the nursing notes.  Pertinent labs & imaging results that were available during my care of the patient were reviewed by me and considered in my medical decision making (see chart for details).     Patient is well-appearing, afebrile, nontoxic, nontachycardic.  She tested negative for COVID and flu.  Chest x-ray was obtained given decreased aeration of bilateral bases and history of smoking that showed no acute cardiopulmonary disease based on my initial read.  We were waiting for radiologist overread at the time of discharge and we will contact her if this differs and changes our treatment plan.  Given her history of  smoking with a 30+ pack year history I am concerned for underlying COPD that has not been diagnosed so we will treat for an exacerbation of this  given her clinical presentation.  She was given Augmentin twice daily for 7 days.  Prednisone 40 mg for 5 days and we discussed that she is not to take NSAIDs with this medication.  She was given Promethazine DM for cough and we discussed that this can be sedating and she is not to drive or drink alcohol while taking it.  She is to rest and drink plenty of fluid.  Albuterol inhaler sent to pharmacy to be used as needed for shortness of breath and coughing fits.  Discussed that if her symptoms are not improving or if anything worsens she needs to be seen immediately.  Strict return precautions given.    Final Clinical Impressions(s) / UC Diagnoses   Final diagnoses:  Sinobronchitis  Acute cough     Discharge Instructions      Start Augmentin twice daily for 7 days.  Use albuterol every 4-6 hours as needed for shortness of breath and coughing fits.  Take Promethazine DM for cough.  This will make you sleepy so do not drive or drink alcohol taking it.  If your symptoms are not improving within a few days please start prednisone 40 mg for 5 days.  I do not see any evidence of pneumonia on your exam but we will contact you if we need to start an additional antibiotic when the radiologist reads this.  If your symptoms are not improving within a week or if anything worsens you need to be seen immediately.     ED Prescriptions     Medication Sig Dispense Auth. Provider   amoxicillin-clavulanate (AUGMENTIN) 875-125 MG tablet Take 1 tablet by mouth every 12 (twelve) hours. 14 tablet Elishia Kaczorowski K, PA-C   promethazine-dextromethorphan (PROMETHAZINE-DM) 6.25-15 MG/5ML syrup Take 5 mLs by mouth 2 (two) times daily as needed for cough. 118 mL Finnley Larusso K, PA-C   predniSONE (DELTASONE) 20 MG tablet Take 2 tablets (40 mg total) by mouth daily for 5 days. 10 tablet Sundy Houchins K, PA-C   albuterol (VENTOLIN HFA) 108 (90 Base) MCG/ACT inhaler Inhale 1-2 puffs into the lungs every 6 (six) hours as needed  for wheezing or shortness of breath. 18 g Senta Kantor K, PA-C      PDMP not reviewed this encounter.   Jeani Hawking, PA-C 05/11/23 2026

## 2023-05-11 NOTE — Discharge Instructions (Signed)
Start Augmentin twice daily for 7 days.  Use albuterol every 4-6 hours as needed for shortness of breath and coughing fits.  Take Promethazine DM for cough.  This will make you sleepy so do not drive or drink alcohol taking it.  If your symptoms are not improving within a few days please start prednisone 40 mg for 5 days.  I do not see any evidence of pneumonia on your exam but we will contact you if we need to start an additional antibiotic when the radiologist reads this.  If your symptoms are not improving within a week or if anything worsens you need to be seen immediately.

## 2023-05-11 NOTE — ED Triage Notes (Addendum)
Cough x 4 days. Temp 101 yesterday. Chest hurts with cough. Bodyaches. Sore throat. Had tylenol this morning. Her boyfriend has flu Sx and was tested this morning but hasn't gotten results yet
